# Patient Record
Sex: Female | Born: 1960
Health system: Southern US, Community
[De-identification: ages and names within clinical notes are randomized; demographics above are authoritative.]

## PROBLEM LIST (undated history)

## (undated) DIAGNOSIS — N390 Urinary tract infection, site not specified: Secondary | ICD-10-CM

## (undated) DIAGNOSIS — J45909 Unspecified asthma, uncomplicated: Secondary | ICD-10-CM

## (undated) DIAGNOSIS — I10 Essential (primary) hypertension: Secondary | ICD-10-CM

## (undated) DIAGNOSIS — B019 Varicella without complication: Secondary | ICD-10-CM

## (undated) DIAGNOSIS — R51 Headache: Secondary | ICD-10-CM

## (undated) DIAGNOSIS — R519 Headache, unspecified: Secondary | ICD-10-CM

## (undated) DIAGNOSIS — T7840XA Allergy, unspecified, initial encounter: Secondary | ICD-10-CM

## (undated) HISTORY — DX: Headache: R51

## (undated) HISTORY — DX: Headache, unspecified: R51.9

## (undated) HISTORY — DX: Urinary tract infection, site not specified: N39.0

## (undated) HISTORY — DX: Allergy, unspecified, initial encounter: T78.40XA

## (undated) HISTORY — DX: Unspecified asthma, uncomplicated: J45.909

## (undated) HISTORY — DX: Essential (primary) hypertension: I10

## (undated) HISTORY — PX: CHOLECYSTECTOMY: SHX55

## (undated) HISTORY — DX: Varicella without complication: B01.9

---

## 1998-10-23 ENCOUNTER — Other Ambulatory Visit: Admission: RE | Admit: 1998-10-23 | Discharge: 1998-10-23 | Payer: Self-pay | Admitting: *Deleted

## 2000-02-02 ENCOUNTER — Other Ambulatory Visit: Admission: RE | Admit: 2000-02-02 | Discharge: 2000-02-02 | Payer: Self-pay | Admitting: *Deleted

## 2000-03-17 ENCOUNTER — Encounter: Admission: RE | Admit: 2000-03-17 | Discharge: 2000-03-17 | Payer: Self-pay | Admitting: Obstetrics and Gynecology

## 2000-03-17 ENCOUNTER — Encounter: Payer: Self-pay | Admitting: Obstetrics and Gynecology

## 2001-02-15 ENCOUNTER — Other Ambulatory Visit: Admission: RE | Admit: 2001-02-15 | Discharge: 2001-02-15 | Payer: Self-pay | Admitting: Obstetrics and Gynecology

## 2002-02-20 ENCOUNTER — Other Ambulatory Visit: Admission: RE | Admit: 2002-02-20 | Discharge: 2002-02-20 | Payer: Self-pay | Admitting: Obstetrics and Gynecology

## 2003-04-10 ENCOUNTER — Other Ambulatory Visit: Admission: RE | Admit: 2003-04-10 | Discharge: 2003-04-10 | Payer: Self-pay | Admitting: Obstetrics and Gynecology

## 2010-07-14 ENCOUNTER — Emergency Department (HOSPITAL_BASED_OUTPATIENT_CLINIC_OR_DEPARTMENT_OTHER)
Admission: EM | Admit: 2010-07-14 | Discharge: 2010-07-14 | Payer: Self-pay | Source: Home / Self Care | Admitting: Emergency Medicine

## 2010-12-09 ENCOUNTER — Emergency Department (HOSPITAL_BASED_OUTPATIENT_CLINIC_OR_DEPARTMENT_OTHER)
Admission: EM | Admit: 2010-12-09 | Discharge: 2010-12-09 | Disposition: A | Discharge status: Home / Self Care | Payer: 59 | Attending: Emergency Medicine | Admitting: Emergency Medicine

## 2010-12-09 DIAGNOSIS — J45909 Unspecified asthma, uncomplicated: Secondary | ICD-10-CM | POA: Insufficient documentation

## 2010-12-09 DIAGNOSIS — S61209A Unspecified open wound of unspecified finger without damage to nail, initial encounter: Secondary | ICD-10-CM | POA: Insufficient documentation

## 2010-12-09 DIAGNOSIS — I10 Essential (primary) hypertension: Secondary | ICD-10-CM | POA: Insufficient documentation

## 2010-12-09 DIAGNOSIS — Y92009 Unspecified place in unspecified non-institutional (private) residence as the place of occurrence of the external cause: Secondary | ICD-10-CM | POA: Insufficient documentation

## 2010-12-09 DIAGNOSIS — W268XXA Contact with other sharp object(s), not elsewhere classified, initial encounter: Secondary | ICD-10-CM | POA: Insufficient documentation

## 2011-08-12 IMAGING — CR DG FOOT COMPLETE 3+V*R*
3 series · 3 of 3 positions shown · non-contrast
Comparison: None

CLINICAL DATA: Lateral pain after trauma.

RIGHT FOOT COMPLETE - 3+ VIEW

[t foot ap right]
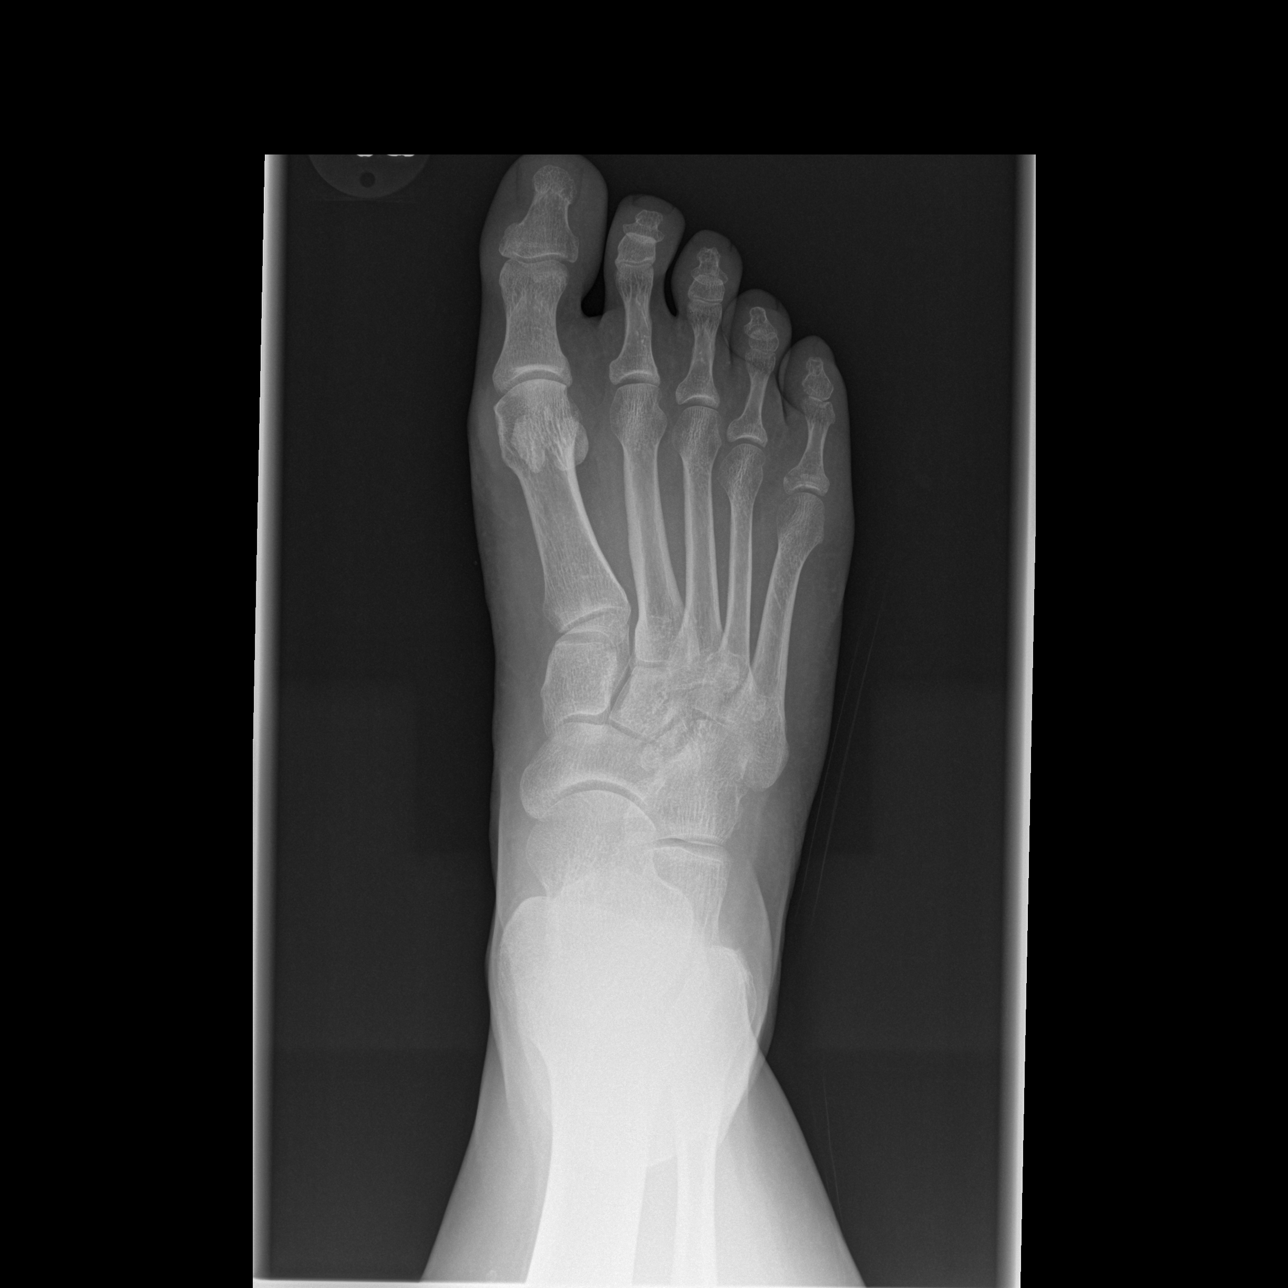

[t foot oblique right]
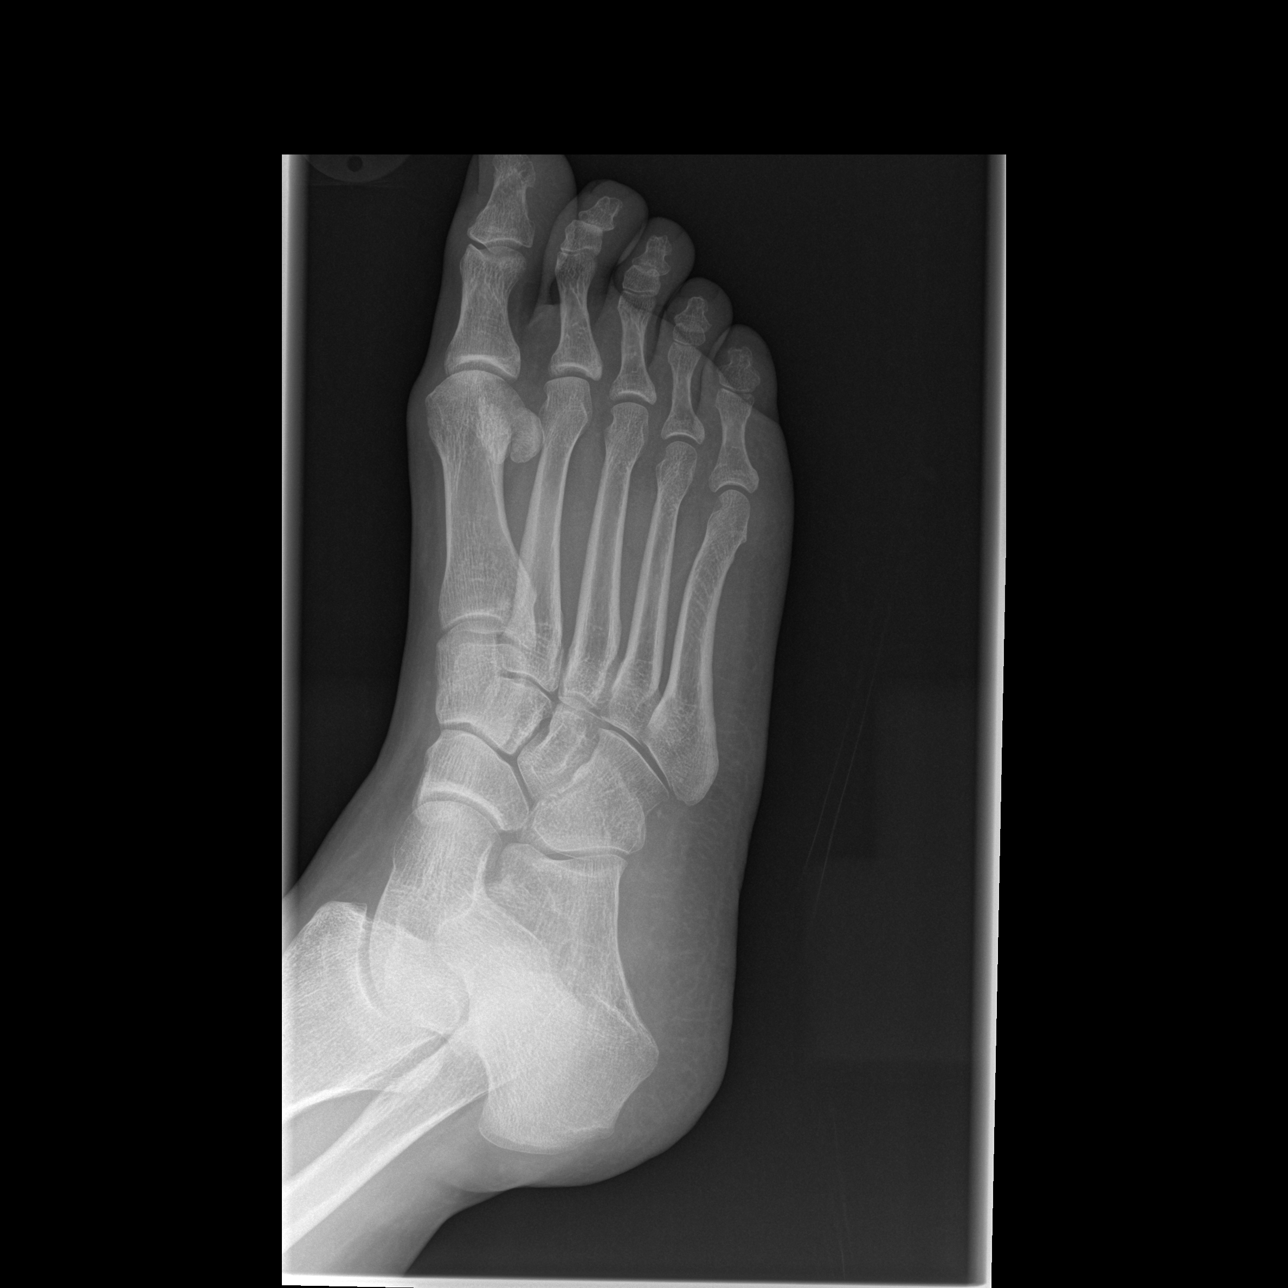

[t foot lat right]
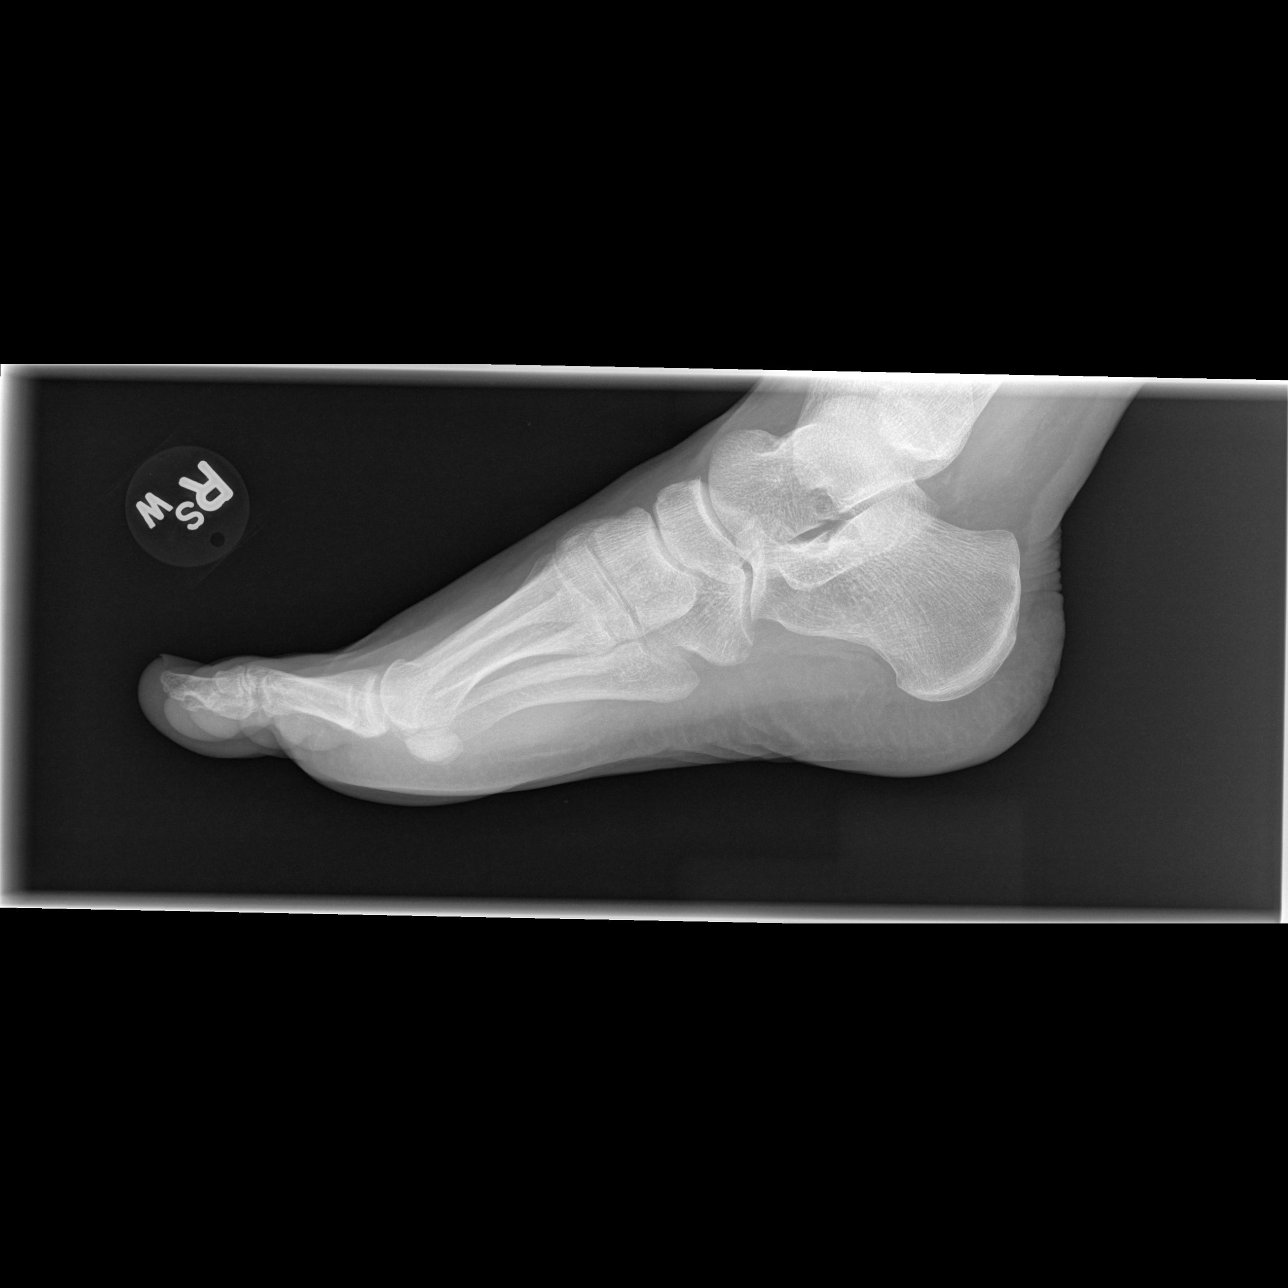

[3 of 3 positions shown; findings below may reference images not displayed]

FINDINGS: There is a fracture of the base of the proximal phalanx
of the fifth toe involving the mesial corner and extending to the
articular surface.  No displacement.  No other regional
abnormalities seen.
IMPRESSION: Fracture of the proximal mesial corner of the proximal phalanx of
the fifth toe extending to the articular surface.  No displacement.

## 2013-02-23 ENCOUNTER — Encounter: Payer: Self-pay | Admitting: Family Medicine

## 2013-02-23 ENCOUNTER — Ambulatory Visit (INDEPENDENT_AMBULATORY_CARE_PROVIDER_SITE_OTHER): Payer: BC Managed Care – PPO | Admitting: Family Medicine

## 2013-02-23 VITALS — BP 132/88 | HR 70 | Temp 97.9°F | Ht 66.0 in | Wt 187.0 lb

## 2013-02-23 DIAGNOSIS — J45909 Unspecified asthma, uncomplicated: Secondary | ICD-10-CM

## 2013-02-23 DIAGNOSIS — I10 Essential (primary) hypertension: Secondary | ICD-10-CM

## 2013-02-23 DIAGNOSIS — E669 Obesity, unspecified: Secondary | ICD-10-CM | POA: Insufficient documentation

## 2013-02-23 DIAGNOSIS — J4520 Mild intermittent asthma, uncomplicated: Secondary | ICD-10-CM | POA: Insufficient documentation

## 2013-02-23 DIAGNOSIS — J309 Allergic rhinitis, unspecified: Secondary | ICD-10-CM

## 2013-02-23 MED ORDER — LISINOPRIL 10 MG PO TABS: 10.0000 mg | ORAL_TABLET | Freq: Every day | ORAL | Status: DC

## 2013-02-23 NOTE — Progress Notes (Signed)
  Subjective:    Patient ID: Jordan Gallegos, female    DOB: 03/15/1961, 52 y.o.   MRN: [id number]  HPI Patient here to establish care. Past medical history significant for mild intermittent asthma. Generally only has about 1 or 2 flareups per month. She is allergic to cat dander and has some seasonal allergies as well. She takes Zyrtec regularly. She's had no evidence to suggest any persistent asthma.  Hypertension treated with lisinopril 10 mg daily. Well-controlled. No dizziness. No headaches. Compliant with therapy. She denies any cough or other side effects.  She's had intermittent urinary tract infections in the past. No prior surgeries. She takes some over-the-counter supplements.  Patient is married. No children. She works as a Engineer, water for [employer]. Never smoked. Rare alcohol.  Family history significant for mother with osteoarthritis. Both parents with hypertension. Father recently diagnosed Parkinson's disease.  Past Medical History  Diagnosis Date  . Asthma   . Chicken pox   . Frequent headaches   . Allergy   . Hypertension   . Urinary tract infection    History reviewed. No pertinent past surgical history.  reports that she has never smoked. She does not have any smokeless tobacco history on file. She reports that  drinks alcohol. She reports that she does not use illicit drugs. family history includes Arthritis in her mother; Hypertension in her father and mother; Parkinson's disease in her father. Allergies  Allergen Reactions  . Tylenol With Codeine #3 [Acetaminophen-Codeine] Nausea And Vomiting      Review of Systems  Constitutional: Negative for appetite change, fatigue and unexpected weight change.  HENT: Positive for congestion.   Eyes: Negative for visual disturbance.  Respiratory: Negative for cough, chest tightness, shortness of breath and wheezing.   Cardiovascular: Negative for chest pain, palpitations and leg swelling.   Endocrine: Negative for polydipsia and polyuria.  Genitourinary: Negative for dysuria.  Neurological: Negative for dizziness, seizures, syncope, weakness, light-headedness and headaches.  Psychiatric/Behavioral: Negative for dysphoric mood.       Objective:   Physical Exam  Constitutional: She appears well-developed and well-nourished.  Neck: Neck supple. No thyromegaly present.  Cardiovascular: Normal rate and regular rhythm.  Exam reveals no gallop.   No murmur heard. Pulmonary/Chest: Effort normal and breath sounds normal. No respiratory distress. She has no wheezes. She has no rales.  Musculoskeletal: She exhibits no edema.          Assessment & Plan:  #1 hypertension. Stable. Refill lisinopril for one year. #2 perennial and seasonal allergies. Stable on Zyrtec. #3 mild intermittent asthma. Continue albuterol as needed. #4 health maintenance. We have suggested complete physical. She is undecided regarding colonoscopy screening. She declines flu vaccine.

## 2013-02-23 NOTE — Patient Instructions (Addendum)
Consider complete physical at some point this year if interested.

## 2013-12-17 ENCOUNTER — Ambulatory Visit: Payer: BC Managed Care – PPO | Admitting: Family Medicine

## 2013-12-20 ENCOUNTER — Emergency Department (HOSPITAL_BASED_OUTPATIENT_CLINIC_OR_DEPARTMENT_OTHER)
Admission: EM | Admit: 2013-12-20 | Discharge: 2013-12-20 | Disposition: A | Discharge status: Home / Self Care | Payer: BC Managed Care – PPO | Attending: Emergency Medicine | Admitting: Emergency Medicine

## 2013-12-20 ENCOUNTER — Encounter (HOSPITAL_BASED_OUTPATIENT_CLINIC_OR_DEPARTMENT_OTHER): Payer: Self-pay | Admitting: Emergency Medicine

## 2013-12-20 DIAGNOSIS — R109 Unspecified abdominal pain: Secondary | ICD-10-CM

## 2013-12-20 DIAGNOSIS — J45909 Unspecified asthma, uncomplicated: Secondary | ICD-10-CM | POA: Insufficient documentation

## 2013-12-20 DIAGNOSIS — Z87440 Personal history of urinary (tract) infections: Secondary | ICD-10-CM | POA: Insufficient documentation

## 2013-12-20 DIAGNOSIS — I10 Essential (primary) hypertension: Secondary | ICD-10-CM | POA: Insufficient documentation

## 2013-12-20 DIAGNOSIS — Z3202 Encounter for pregnancy test, result negative: Secondary | ICD-10-CM | POA: Insufficient documentation

## 2013-12-20 DIAGNOSIS — R1032 Left lower quadrant pain: Secondary | ICD-10-CM | POA: Insufficient documentation

## 2013-12-20 DIAGNOSIS — Z8619 Personal history of other infectious and parasitic diseases: Secondary | ICD-10-CM | POA: Insufficient documentation

## 2013-12-20 DIAGNOSIS — E669 Obesity, unspecified: Secondary | ICD-10-CM | POA: Insufficient documentation

## 2013-12-20 DIAGNOSIS — Z79899 Other long term (current) drug therapy: Secondary | ICD-10-CM | POA: Insufficient documentation

## 2013-12-20 LAB — CBC WITH DIFFERENTIAL/PLATELET
Basophils Absolute: 0.1 10*3/uL (ref 0.0–0.1)
Basophils Relative: 1 % (ref 0–1)
Eosinophils Absolute: 0.2 10*3/uL (ref 0.0–0.7)
Eosinophils Relative: 2 % (ref 0–5)
HEMATOCRIT: 42.8 % (ref 36.0–46.0)
Hemoglobin: 14.2 g/dL (ref 12.0–15.0)
LYMPHS PCT: 28 % (ref 12–46)
Lymphs Abs: 2.3 10*3/uL (ref 0.7–4.0)
MCH: 30.7 pg (ref 26.0–34.0)
MCHC: 33.2 g/dL (ref 30.0–36.0)
MCV: 92.4 fL (ref 78.0–100.0)
MONO ABS: 0.8 10*3/uL (ref 0.1–1.0)
Monocytes Relative: 9 % (ref 3–12)
NEUTROS ABS: 5 10*3/uL (ref 1.7–7.7)
Neutrophils Relative %: 60 % (ref 43–77)
Platelets: 278 10*3/uL (ref 150–400)
RBC: 4.63 MIL/uL (ref 3.87–5.11)
RDW: 12.9 % (ref 11.5–15.5)
WBC: 8.3 10*3/uL (ref 4.0–10.5)

## 2013-12-20 LAB — URINALYSIS, ROUTINE W REFLEX MICROSCOPIC
BILIRUBIN URINE: NEGATIVE
GLUCOSE, UA: NEGATIVE mg/dL
KETONES UR: NEGATIVE mg/dL
Leukocytes, UA: NEGATIVE
Nitrite: NEGATIVE
PH: 7 (ref 5.0–8.0)
PROTEIN: NEGATIVE mg/dL
Specific Gravity, Urine: 1.012 (ref 1.005–1.030)
Urobilinogen, UA: 0.2 mg/dL (ref 0.0–1.0)

## 2013-12-20 LAB — COMPREHENSIVE METABOLIC PANEL
ALT: 14 U/L (ref 0–35)
ANION GAP: 16 — AB (ref 5–15)
AST: 14 U/L (ref 0–37)
Albumin: 4.2 g/dL (ref 3.5–5.2)
Alkaline Phosphatase: 63 U/L (ref 39–117)
BUN: 14 mg/dL (ref 6–23)
CO2: 23 mEq/L (ref 19–32)
Calcium: 10.2 mg/dL (ref 8.4–10.5)
Chloride: 101 mEq/L (ref 96–112)
Creatinine, Ser: 0.8 mg/dL (ref 0.50–1.10)
GFR calc Af Amer: >90 mL/min (ref >90)
GFR calc non Af Amer: 84 mL/min — ABNORMAL LOW (ref >90)
Glucose, Bld: 117 mg/dL — ABNORMAL HIGH (ref 70–99)
POTASSIUM: 3.9 meq/L (ref 3.7–5.3)
SODIUM: 140 meq/L (ref 137–147)
TOTAL PROTEIN: 7.4 g/dL (ref 6.0–8.3)
Total Bilirubin: 0.3 mg/dL (ref 0.3–1.2)

## 2013-12-20 LAB — URINE MICROSCOPIC-ADD ON

## 2013-12-20 LAB — LIPASE, BLOOD: Lipase: 24 U/L (ref 11–59)

## 2013-12-20 LAB — PREGNANCY, URINE: Preg Test, Ur: NEGATIVE

## 2013-12-20 MED ORDER — PB-HYOSCY-ATROPINE-SCOPOLAMINE 16.2 MG PO TABS: 1.0000 | ORAL_TABLET | Freq: Three times a day (TID) | ORAL | Status: DC | PRN

## 2013-12-20 MED ORDER — GI COCKTAIL ~~LOC~~
30.0000 mL | Freq: Once | ORAL | Status: AC
  Administered 2013-12-20: 30 mL via ORAL

## 2013-12-20 MED ORDER — GI COCKTAIL ~~LOC~~
ORAL | Status: AC
  Filled 2013-12-20: qty 30

## 2013-12-20 NOTE — Discharge Instructions (Signed)
You've been prescribed the same medicine that you got here tonight in tablet form. Call Dr. Elease Hashimoto to schedule an office visit. We feel that he should have a referral to a gastroenterologist and that you should have a colonoscopy. Your blood pressure should be rechecked within a week. Tonight it was elevated at 179/87

## 2013-12-20 NOTE — ED Notes (Signed)
Pt c/o diffuse abd pain with nausea only x 5 hrs

## 2013-12-20 NOTE — ED Notes (Signed)
MD at bedside discussing test results and dispo plan of care. 

## 2013-12-20 NOTE — ED Provider Notes (Signed)
CSN: [id number]     Arrival date & time 12/20/13  0011 History   First MD Initiated Contact with Patient 12/20/13 0018     Chief Complaint  Patient presents with  . Abdominal Pain     (Consider location/radiation/quality/duration/timing/severity/associated sxs/prior Treatment) HPI Complains of epigastric pain onset 7 PM tonight after eating pork chips. Pain became worse at 10 PM tonight, sharp and severe. Nonradiating. Pain is presently improving steadily, without treatment. Patient has had similar pain several times in the past which resolve spontaneously after walking. Her last bowel movement was yesterday morning, normal. Associated symptoms include nausea, no vomiting. No chest pain. Last menstrual period 3 weeks ago, and normal. No other associated symptoms. Past Medical History  Diagnosis Date  . Asthma   . Chicken pox   . Frequent headaches   . Allergy   . Hypertension   . Urinary tract infection    History reviewed. No pertinent past surgical history. Family History  Problem Relation Age of Onset  . Arthritis Mother   . Hypertension Mother   . Hypertension Father   . Parkinson's disease Father    past surgical history no abdominal surgeries History  Substance Use Topics  . Smoking status: Never Smoker   . Smokeless tobacco: Not on file  . Alcohol Use: Yes   OB History   Grav Para Term Preterm Abortions TAB SAB Ect Mult Living                 Review of Systems  Gastrointestinal: Positive for nausea and abdominal pain.       Nausea has resolved      Allergies  Tylenol with codeine #3  Home Medications   Prior to Admission medications   Medication Sig Start Date End Date Taking? Authorizing Provider  albuterol (PROVENTIL HFA;VENTOLIN HFA) 108 (90 BASE) MCG/ACT inhaler Inhale 2 puffs into the lungs as needed for wheezing.    Historical Provider, MD  cetirizine (ZYRTEC) 10 MG tablet Take 10 mg by mouth daily.    Historical Provider, MD  Coenzyme Q10 100 MG  TABS Take by mouth daily.    Historical Provider, MD  Flaxseed, Linseed, OIL Take 1,400 mg by mouth daily.    Historical Provider, MD  levonorgestrel-ethinyl estradiol (ORSYTHIA) 0.1-20 MG-MCG tablet Take 1 tablet by mouth daily.    Historical Provider, MD  lisinopril (PRINIVIL,ZESTRIL) 10 MG tablet Take 1 tablet (10 mg total) by mouth daily. 02/23/13   Eulas Post, MD  Magnesium 400 MG CAPS Take 400 mg by mouth daily.    Historical Provider, MD  Multiple Vitamins-Calcium (ONE-A-DAY WITHIN PO) Take by mouth daily.    Historical Provider, MD  NON FORMULARY Asthma Clear. Take one daily    Historical Provider, MD  vitamin E (VITAMIN E) 400 UNIT capsule Take 400 Units by mouth daily.    Historical Provider, MD   BP 179/87  Pulse 66  Temp(Src) 98.2 F (36.8 C) (Oral)  Resp 18  Ht 5\' 6"  (1.676 m)  Wt 180 lb (81.647 kg)  BMI 29.07 kg/m2  SpO2 100%  LMP 11/28/2013 Physical Exam  Nursing note and vitals reviewed. Constitutional: She appears well-developed and well-nourished.  HENT:  Head: Normocephalic and atraumatic.  Eyes: Conjunctivae are normal. Pupils are equal, round, and reactive to light.  Neck: Neck supple. No tracheal deviation present. No thyromegaly present.  Cardiovascular: Normal rate and regular rhythm.   No murmur heard. Pulmonary/Chest: Effort normal and breath sounds normal.  Abdominal: Soft. Bowel  sounds are normal. She exhibits no distension. There is tenderness.  Obese. Tender over bilateral lower quadrants, no guarding rigidity or rebound  Musculoskeletal: Normal range of motion. She exhibits no edema and no tenderness.  Neurological: She is alert. Coordination normal.  Skin: Skin is warm and dry. No rash noted.  Psychiatric: She has a normal mood and affect.    ED Course  Procedures (including critical care time) Labs Review Labs Reviewed  URINALYSIS, ROUTINE W REFLEX MICROSCOPIC  PREGNANCY, URINE    Imaging Review No results found.   EKG  Interpretation None      declines pain medicine   140 am pain continues to improve  2:15 AM patient states pain is steadily improving, waxes and wanes. She is concerned that she may not be an asleep with the amount of pain that she has. We will try GI cocktail  2:40 AM pain is improved after GI cocktail. Patient comfortable. Results for orders placed during the hospital encounter of 12/20/13  URINALYSIS, ROUTINE W REFLEX MICROSCOPIC      Result Value Ref Range   Color, Urine YELLOW  YELLOW   APPearance CLEAR  CLEAR   Specific Gravity, Urine 1.012  1.005 - 1.030   pH 7.0  5.0 - 8.0   Glucose, UA NEGATIVE  NEGATIVE mg/dL   Hgb urine dipstick SMALL (*) NEGATIVE   Bilirubin Urine NEGATIVE  NEGATIVE   Ketones, ur NEGATIVE  NEGATIVE mg/dL   Protein, ur NEGATIVE  NEGATIVE mg/dL   Urobilinogen, UA 0.2  0.0 - 1.0 mg/dL   Nitrite NEGATIVE  NEGATIVE   Leukocytes, UA NEGATIVE  NEGATIVE  PREGNANCY, URINE      Result Value Ref Range   Preg Test, Ur NEGATIVE  NEGATIVE  URINE MICROSCOPIC-ADD ON      Result Value Ref Range   Squamous Epithelial / LPF FEW (*) RARE   WBC, UA 3-6  <3 WBC/hpf   RBC / HPF 3-6  <3 RBC/hpf   Bacteria, UA MANY (*) RARE  COMPREHENSIVE METABOLIC PANEL      Result Value Ref Range   Sodium 140  137 - 147 mEq/L   Potassium 3.9  3.7 - 5.3 mEq/L   Chloride 101  96 - 112 mEq/L   CO2 23  19 - 32 mEq/L   Glucose, Bld 117 (*) 70 - 99 mg/dL   BUN 14  6 - 23 mg/dL   Creatinine, Ser 0.80  0.50 - 1.10 mg/dL   Calcium 10.2  8.4 - 10.5 mg/dL   Total Protein 7.4  6.0 - 8.3 g/dL   Albumin 4.2  3.5 - 5.2 g/dL   AST 14  0 - 37 U/L   ALT 14  0 - 35 U/L   Alkaline Phosphatase 63  39 - 117 U/L   Total Bilirubin 0.3  0.3 - 1.2 mg/dL   GFR calc non Af Amer 84 (*) >90 mL/min   GFR calc Af Amer >90  >90 mL/min   Anion gap 16 (*) 5 - 15  CBC WITH DIFFERENTIAL      Result Value Ref Range   WBC 8.3  4.0 - 10.5 K/uL   RBC 4.63  3.87 - 5.11 MIL/uL   Hemoglobin 14.2  12.0 - 15.0 g/dL    HCT 42.8  36.0 - 46.0 %   MCV 92.4  78.0 - 100.0 fL   MCH 30.7  26.0 - 34.0 pg   MCHC 33.2  30.0 - 36.0 g/dL   RDW 12.9  11.5 - 15.5 %   Platelets 278  150 - 400 K/uL   Neutrophils Relative % 60  43 - 77 %   Neutro Abs 5.0  1.7 - 7.7 K/uL   Lymphocytes Relative 28  12 - 46 %   Lymphs Abs 2.3  0.7 - 4.0 K/uL   Monocytes Relative 9  3 - 12 %   Monocytes Absolute 0.8  0.1 - 1.0 K/uL   Eosinophils Relative 2  0 - 5 %   Eosinophils Absolute 0.2  0.0 - 0.7 K/uL   Basophils Relative 1  0 - 1 %   Basophils Absolute 0.1  0.0 - 0.1 K/uL  LIPASE, BLOOD      Result Value Ref Range   Lipase 24  11 - 59 U/L   No results found.  MDM  Since there is a spasmodic component to her pain I will prescribe Donnatal. She is instructed to get blood pressure rechecked within one week. Also to call her primary care physician for referral to a gastroenterologist as she's never had a colonoscopy Diagnosis #1 abdominal pain #2 hypertension Final diagnoses:  None        Orlie Dakin, MD 12/20/13 [phone]

## 2013-12-20 NOTE — ED Notes (Signed)
MD at bedside. 

## 2013-12-22 ENCOUNTER — Encounter: Payer: Self-pay | Admitting: Family Medicine

## 2013-12-23 ENCOUNTER — Encounter: Payer: Self-pay | Admitting: Family Medicine

## 2013-12-24 ENCOUNTER — Telehealth: Payer: Self-pay | Admitting: Family Medicine

## 2013-12-24 NOTE — Telephone Encounter (Signed)
Pt requesting referral to GI specialist due to abdominal pain, was seen at [facility] on 12/20/13.

## 2013-12-24 NOTE — Telephone Encounter (Signed)
Spoke with Patient, informed patient that she will need to make ED follow up appt.

## 2013-12-25 ENCOUNTER — Encounter: Payer: Self-pay | Admitting: Internal Medicine

## 2013-12-26 ENCOUNTER — Ambulatory Visit: Payer: BC Managed Care – PPO | Admitting: Family Medicine

## 2013-12-28 ENCOUNTER — Ambulatory Visit (INDEPENDENT_AMBULATORY_CARE_PROVIDER_SITE_OTHER): Payer: BC Managed Care – PPO | Admitting: Internal Medicine

## 2013-12-28 ENCOUNTER — Encounter: Payer: Self-pay | Admitting: Internal Medicine

## 2013-12-28 VITALS — BP 128/64 | HR 64 | Ht 66.0 in | Wt 189.0 lb

## 2013-12-28 DIAGNOSIS — R1013 Epigastric pain: Secondary | ICD-10-CM

## 2013-12-28 DIAGNOSIS — K8020 Calculus of gallbladder without cholecystitis without obstruction: Secondary | ICD-10-CM | POA: Insufficient documentation

## 2013-12-28 MED ORDER — HYDROCODONE-ACETAMINOPHEN 5-325 MG PO TABS: 1.0000 | ORAL_TABLET | Freq: Four times a day (QID) | ORAL | Status: DC | PRN

## 2013-12-28 NOTE — Assessment & Plan Note (Signed)
The symptom pattern is a decent story for symptomatic cholelithiasis I believe. We will get an abdominal ultrasound she has gallstones or more for to a surgeon. If there aren't any we'll need to consider a HIDA scan with ejection fraction. She was advised about persistent symptoms fever and reasons to go to the emergency department.

## 2013-12-28 NOTE — Patient Instructions (Signed)
Today you have been given a printed rx for vicodin to take to your pharmacy.  You have been scheduled for an abdominal ultrasound at [facility] (1st floor of hospital) on 01/02/14 at 8:00am. Please arrive 15 minutes prior to your appointment for registration. Make certain not to have anything to eat or drink after midnight. Should you need to reschedule your appointment, please contact radiology at [phone]. This test typically takes about 30 minutes to perform.  I appreciate the opportunity to care for you.

## 2013-12-28 NOTE — Progress Notes (Signed)
  Referred by: Eulas Post, MD  Subjective:    Patient ID: Jordan Gallegos, female    DOB: [birth year], 53 y.o.   MRN: [id number]  HPI Very nice lady was had intermittent epigastric pain. It started sometime in the last year. It is worsened, it is typically postprandial. It starts in the evening after her evening meal. It starts in the epigastrium and seems to radiate out over the mid and lower abdomen more diffusely with time. She had a couple of occasions happening in the winter, in the spring she had one that woke her up at night. She had severe pain in early July and went to the ER where the doctor thought maybe she was having intestinal spasms and prescribed Donnatal. That did not help when she had a recurrent episode. She is currently afraid to eat much. It's not always her fatty foods but it has occurred after eating pork chips and also after eating ham. She does not have any bowel habit problems. She has not had nausea or vomiting. There is no typical heartburn symptomatology.   Review of Systems Allergies and sinus problems all otherwise negative    Objective:   Physical Exam General:  Well-developed, well-nourished and in no acute distress Eyes:  anicteric. ENT:   Mouth and posterior pharynx free of lesions.  Neck:   supple w/o thyromegaly or mass.  Lungs: Clear to auscultation bilaterally. Heart:  S1S2, no rubs, murmurs, gallops. Abdomen:  soft, non-tender, no hepatosplenomegaly, hernia, or mass and BS+.  Lymph:  no cervical or supraclavicular adenopathy. Extremities:   no edema Skin   no rash. Neuro:  A&O x 3.  Psych:  appropriate mood and  Affect.   Data Reviewed: ER notes labs, liver function tests were normal. Lipase was normal, July 2    Assessment & Plan:  Abdominal pain, epigastric The symptom pattern is a decent story for symptomatic cholelithiasis I believe. We will get an abdominal ultrasound she has gallstones or more for to a surgeon. If there aren't any  we'll need to consider a HIDA scan with ejection fraction. She was advised about persistent symptoms fever and reasons to go to the emergency department.   She should have a screening colonoscopy at some point as well. We've decided to defer that at this point we'll followup once the ultrasound is completed.  I appreciate the opportunity to care for this patient. CC: Eulas Post, MD

## 2014-01-02 ENCOUNTER — Ambulatory Visit (HOSPITAL_COMMUNITY)
Admission: RE | Admit: 2014-01-02 | Discharge: 2014-01-02 | Disposition: A | Discharge status: Home / Self Care | Payer: BC Managed Care – PPO | Source: Ambulatory Visit | Attending: Internal Medicine | Admitting: Internal Medicine

## 2014-01-02 ENCOUNTER — Encounter: Payer: Self-pay | Admitting: Internal Medicine

## 2014-01-02 DIAGNOSIS — K8020 Calculus of gallbladder without cholecystitis without obstruction: Secondary | ICD-10-CM | POA: Insufficient documentation

## 2014-01-02 DIAGNOSIS — R1013 Epigastric pain: Secondary | ICD-10-CM | POA: Insufficient documentation

## 2014-01-02 NOTE — Progress Notes (Signed)
Quick Note:  She has gallstones and needs to see a surgeon about this and her epigastric pain - symptomatic cholelithiasis. Please schedule with a surgeon at [facility]  She should schedule a screening colonoscopy after the surgery visit and cholecystectomy is completed.    ______

## 2014-01-03 ENCOUNTER — Other Ambulatory Visit: Payer: Self-pay

## 2014-01-03 ENCOUNTER — Telehealth: Payer: Self-pay | Admitting: Internal Medicine

## 2014-01-03 DIAGNOSIS — R1013 Epigastric pain: Secondary | ICD-10-CM

## 2014-01-03 DIAGNOSIS — K8020 Calculus of gallbladder without cholecystitis without obstruction: Secondary | ICD-10-CM

## 2014-01-03 DIAGNOSIS — K8309 Other cholangitis: Secondary | ICD-10-CM

## 2014-01-03 NOTE — Telephone Encounter (Signed)
See results note on [place] for additional details.

## 2014-01-23 ENCOUNTER — Encounter (INDEPENDENT_AMBULATORY_CARE_PROVIDER_SITE_OTHER): Payer: Self-pay | Admitting: Surgery

## 2014-01-23 ENCOUNTER — Ambulatory Visit (INDEPENDENT_AMBULATORY_CARE_PROVIDER_SITE_OTHER): Payer: BC Managed Care – PPO | Admitting: Surgery

## 2014-01-23 VITALS — BP 116/62 | HR 68 | Temp 98.0°F | Resp 18 | Ht 66.0 in | Wt 184.0 lb

## 2014-01-23 DIAGNOSIS — K8020 Calculus of gallbladder without cholecystitis without obstruction: Secondary | ICD-10-CM

## 2014-01-23 NOTE — Progress Notes (Signed)
General Surgery [facility]  Chief Complaint  Patient presents with  . New Evaluation    cholelithiasis, evaluate for cholecystectomy - referral from Dr. Silvano Rusk    HISTORY: Patient is a 53 year old female referred by her gastroenterologist for symptomatic cholelithiasis. Patient has been having symptoms for approximately one year. She describes epigastric abdominal pain following meals. This may last up to 5 hours it is quite severe. It often occurs late at night. It is associated with nausea but no emesis. She denies fevers or chills. She denies jaundice or acholic stools.  Patient has had no prior abdominal surgery. There is a family history of gallbladder disease in a maternal grandmother.  Past Medical History  Diagnosis Date  . Asthma   . Chicken pox   . Frequent headaches   . Allergy   . Hypertension   . Urinary tract infection     Current Outpatient Prescriptions  Medication Sig Dispense Refill  . albuterol (PROVENTIL HFA;VENTOLIN HFA) 108 (90 BASE) MCG/ACT inhaler Inhale 2 puffs into the lungs as needed for wheezing.      . Coenzyme Q10 100 MG TABS Take by mouth daily.      . Flaxseed, Linseed, OIL Take 1,400 mg by mouth daily.      Marland Kitchen HYDROcodone-acetaminophen (NORCO/VICODIN) 5-325 MG per tablet Take 1 tablet by mouth every 6 (six) hours as needed for moderate pain.  15 tablet  0  . levocetirizine (XYZAL) 5 MG tablet Take 5 mg by mouth every evening.       Marland Kitchen levonorgestrel-ethinyl estradiol (ORSYTHIA) 0.1-20 MG-MCG tablet Take 1 tablet by mouth daily.      Marland Kitchen lisinopril (PRINIVIL,ZESTRIL) 10 MG tablet Take 1 tablet (10 mg total) by mouth daily.  90 tablet  3  . Magnesium 400 MG CAPS Take 400 mg by mouth daily.      . montelukast (SINGULAIR) 10 MG tablet Take 10 mg by mouth at bedtime.       . Multiple Vitamins-Calcium (ONE-A-DAY WITHIN PO) Take by mouth daily.      . vitamin E (VITAMIN E) 400 UNIT capsule Take 400 Units by mouth daily.       No  current facility-administered medications for this visit.    Allergies  Allergen Reactions  . Tylenol With Codeine #3 [Acetaminophen-Codeine] Nausea And Vomiting    Family History  Problem Relation Age of Onset  . Arthritis Mother   . Hypertension Mother   . Hypertension Father   . Parkinson's disease Father   . Colon cancer Neg Hx   . Stomach cancer Neg Hx     History   Social History  . Marital Status: Married    Spouse Name: N/A    Number of Children: 0  . Years of Education: N/A   Occupational History  .     Social History Main Topics  . Smoking status: Never Smoker   . Smokeless tobacco: Never Used  . Alcohol Use: No  . Drug Use: No  . Sexual Activity: Yes    Birth Control/ Protection: Pill   Other Topics Concern  . 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 Denies acholic stools. Denies fevers or chills.  EXAM: Filed Vitals:  01/23/14 0940  BP: 116/62  Pulse: 68  Temp: 98 F (36.7 C)  Resp: 18    GENERAL: well-developed, well-nourished, no acute distress HEENT: normocephalic; pupils equal and reactive; sclerae clear; dentition good; mucous membranes moist NECK:  No palpable masses in the thyroid bed; symmetric on extension; no palpable anterior or posterior cervical lymphadenopathy; no supraclavicular masses; no tenderness CHEST: clear to auscultation bilaterally without rales, rhonchi, or wheezes CARDIAC: regular rate and rhythm without significant murmur; peripheral pulses are full ABDOMEN: soft without distension; bowel sounds present; no mass; no hepatosplenomegaly; small umbilical hernia, asymptomatic EXT:  non-tender without edema; no deformity NEURO: no gross focal deficits; no sign of tremor   LABORATORY RESULTS: See [facility] (CHL-Epic) for most  recent results  RADIOLOGY RESULTS: See [facility] (CHL-Epic) for most recent results  IMPRESSION: Symptomatic cholelithiasis, probable chronic cholecystitis  PLAN: I discussed the above findings at length with the patient and her husband. I provided them with written literature to review at home.  I have recommended laparoscopic cholecystectomy with intraoperative cholangiography. We discussed the procedure at length. We discussed potential complications including common bile duct injury and conversion to open surgery. We discussed the hospital stay to be anticipated in the postoperative recovery and return to work. Patient would like to proceed with surgery in the near future.  The risks and benefits of the procedure have been discussed at length with the patient.  The patient understands the proposed procedure, potential alternative treatments, and the course of recovery to be expected.  All of the patient's questions have been answered at this time.  The patient wishes to proceed with surgery.  Earnstine Regal, MD, [facility]  Primary Care Physician: Eulas Post, MD

## 2014-01-23 NOTE — Patient Instructions (Signed)
  [facility]  LAPAROSCOPIC SURGERY - POST-OP INSTRUCTIONS  Always review your discharge instruction sheet given to you by the facility where your surgery was performed.  A prescription for pain medication may be given to you upon discharge.  Take your pain medication as prescribed.  If narcotic pain medicine is not needed, then you may take acetaminophen (Tylenol) or ibuprofen (Advil) as needed.  Take your usually prescribed medications unless otherwise directed.  If you need a refill on your pain medication, please contact your pharmacy.  They will contact our office to request authorization. Prescriptions will not be filled after 5 P.M. or on weekends.  You should follow a light diet the first few days after arrival home, such as soup and crackers or toast.  Be sure to include plenty of fluids daily.  Most patients will experience some swelling and bruising in the area of the incisions.  Ice packs will help.  Swelling and bruising can take several days to resolve.   It is common to experience some constipation if taking pain medication after surgery.  Increasing fluid intake and taking a stool softener (such as Colace) will usually help or prevent this problem from occurring.  A mild laxative (Milk of Magnesia or Miralax) should be taken according to package instructions if there are no bowel movements after 48 hours.  Unless discharge instructions indicate otherwise, you may remove your bandages 24-48 hours after surgery, and you may shower at that time.  You may have steri-strips (small skin tapes) in place directly over the incision.  These strips should be left on the skin for 7-10 days.  If your surgeon used skin glue on the incision, you may shower in 24 hours.  The glue will flake off over the next 2-3 weeks.  Any sutures or staples will be removed at the office during your follow-up visit.  ACTIVITIES:  You may resume regular (light) daily activities beginning the  next day-such as daily self-care, walking, climbing stairs-gradually increasing activities as tolerated.  You may have sexual intercourse when it is comfortable.  Refrain from any heavy lifting or straining until approved by your doctor.  You may drive when you are no longer taking prescription pain medication, you can comfortably wear a seatbelt, and you can safely maneuver your car and apply brakes.  You should see your doctor in the office for a follow-up appointment approximately 2-3 weeks after your surgery.  Make sure that you call for this appointment within a day or two after you arrive home to insure a convenient appointment time.  WHEN TO CALL YOUR DOCTOR: 1. Fever over 101.0 2. Inability to urinate 3. Continued bleeding from incision 4. Increased pain, redness, or drainage from the incision 5. Increasing abdominal pain  The clinic staff is available to answer your questions during regular business hours.  Please don't hesitate to call and ask to speak to one of the nurses for clinical concerns.  If you have a medical emergency, go to the nearest emergency room or call 911.  A surgeon from [facility] is always on call for the hospital.  Shaton Lore M. Willmer Fellers, MD, FACS [facility] Office: [phone] Toll Free:  [phone] FAX [phone]  Web site: [email or web address] 

## 2014-01-29 ENCOUNTER — Encounter (INDEPENDENT_AMBULATORY_CARE_PROVIDER_SITE_OTHER): Payer: Self-pay | Admitting: Surgery

## 2014-02-17 ENCOUNTER — Encounter: Payer: Self-pay | Admitting: Family Medicine

## 2014-02-18 ENCOUNTER — Other Ambulatory Visit: Payer: Self-pay

## 2014-02-18 MED ORDER — LISINOPRIL 10 MG PO TABS: 10.0000 mg | ORAL_TABLET | Freq: Every day | ORAL | Status: DC

## 2014-02-26 ENCOUNTER — Encounter: Payer: Self-pay | Admitting: Family Medicine

## 2014-02-26 ENCOUNTER — Ambulatory Visit (INDEPENDENT_AMBULATORY_CARE_PROVIDER_SITE_OTHER): Payer: BC Managed Care – PPO | Admitting: Family Medicine

## 2014-02-26 VITALS — BP 130/80 | HR 78 | Temp 98.1°F | Ht 66.0 in | Wt 182.0 lb

## 2014-02-26 DIAGNOSIS — Z0000 Encounter for general adult medical examination without abnormal findings: Secondary | ICD-10-CM

## 2014-02-26 LAB — TSH: TSH: 0.89 u[IU]/mL (ref 0.35–4.50)

## 2014-02-26 LAB — BASIC METABOLIC PANEL
BUN: 13 mg/dL (ref 6–23)
CHLORIDE: 105 meq/L (ref 96–112)
CO2: 24 mEq/L (ref 19–32)
Calcium: 9.7 mg/dL (ref 8.4–10.5)
Creatinine, Ser: 0.8 mg/dL (ref 0.4–1.2)
GFR: 78.94 mL/min (ref >60.00)
GLUCOSE: 83 mg/dL (ref 70–99)
POTASSIUM: 4.1 meq/L (ref 3.5–5.1)
Sodium: 140 mEq/L (ref 135–145)

## 2014-02-26 LAB — HEPATIC FUNCTION PANEL
ALK PHOS: 61 U/L (ref 39–117)
ALT: 22 U/L (ref 0–35)
AST: 23 U/L (ref 0–37)
Albumin: 4.4 g/dL (ref 3.5–5.2)
Bilirubin, Direct: 0 mg/dL (ref 0.0–0.3)
Total Bilirubin: 0.7 mg/dL (ref 0.2–1.2)
Total Protein: 7.5 g/dL (ref 6.0–8.3)

## 2014-02-26 LAB — CBC WITH DIFFERENTIAL/PLATELET
Basophils Absolute: 0.1 10*3/uL (ref 0.0–0.1)
Basophils Relative: 1 % (ref 0.0–3.0)
EOS PCT: 2.9 % (ref 0.0–5.0)
Eosinophils Absolute: 0.2 10*3/uL (ref 0.0–0.7)
HCT: 44.8 % (ref 36.0–46.0)
Hemoglobin: 15.1 g/dL — ABNORMAL HIGH (ref 12.0–15.0)
Lymphocytes Relative: 31.5 % (ref 12.0–46.0)
Lymphs Abs: 2.4 10*3/uL (ref 0.7–4.0)
MCHC: 33.7 g/dL (ref 30.0–36.0)
MCV: 92.9 fl (ref 78.0–100.0)
MONO ABS: 0.4 10*3/uL (ref 0.1–1.0)
MONOS PCT: 5.7 % (ref 3.0–12.0)
Neutro Abs: 4.5 10*3/uL (ref 1.4–7.7)
Neutrophils Relative %: 58.9 % (ref 43.0–77.0)
PLATELETS: 258 10*3/uL (ref 150.0–400.0)
RBC: 4.82 Mil/uL (ref 3.87–5.11)
RDW: 14.2 % (ref 11.5–15.5)
WBC: 7.6 10*3/uL (ref 4.0–10.5)

## 2014-02-26 LAB — LIPID PANEL
Cholesterol: 235 mg/dL — ABNORMAL HIGH (ref 0–200)
HDL: 57.9 mg/dL (ref >39.00)
LDL CALC: 144 mg/dL — AB (ref 0–99)
NONHDL: 177.1
Total CHOL/HDL Ratio: 4
Triglycerides: 167 mg/dL — ABNORMAL HIGH (ref 0.0–149.0)
VLDL: 33.4 mg/dL (ref 0.0–40.0)

## 2014-02-26 MED ORDER — LISINOPRIL 10 MG PO TABS: 10.0000 mg | ORAL_TABLET | Freq: Every day | ORAL | Status: DC

## 2014-02-26 NOTE — Progress Notes (Signed)
   Subjective:    Patient ID: Jordan Gallegos, female    DOB: January 15, 1961, 53 y.o.   MRN: [id number]  HPI Patient seen for complete physical. She sees gynecologist regularly. She has had some recent problems over the summer with right upper quadrant abdominal pain. Ultrasound revealed gallstones. She is scheduled for laparoscopic cholecystectomy next week. No recent active pain issues. She's never had screening colonoscopy. She had mammogram and Pap smear back in March of this year. Tetanus 2012. She declines flu vaccine.  Nonsmoker. Hypertension controlled with lisinopril.  Past Medical History  Diagnosis Date  . Asthma   . Chicken pox   . Frequent headaches   . Allergy   . Hypertension   . Urinary tract infection    No past surgical history on file.  reports that she has never smoked. She has never used smokeless tobacco. She reports that she does not drink alcohol or use illicit drugs. family history includes Arthritis in her mother; Hypertension in her father and mother; Parkinson's disease in her father. There is no history of Colon cancer or Stomach cancer. Allergies  Allergen Reactions  . Tylenol With Codeine #3 [Acetaminophen-Codeine] Nausea And Vomiting      Review of Systems  Constitutional: Negative for fever, activity change, appetite change, fatigue and unexpected weight change.  HENT: Negative for ear pain, hearing loss, sore throat and trouble swallowing.   Eyes: Negative for visual disturbance.  Respiratory: Negative for cough and shortness of breath.   Cardiovascular: Negative for chest pain and palpitations.  Gastrointestinal: Negative for abdominal pain, diarrhea, constipation and blood in stool.  Genitourinary: Negative for dysuria and hematuria.  Musculoskeletal: Negative for arthralgias, back pain and myalgias.  Skin: Negative for rash.  Neurological: Negative for dizziness, syncope and headaches.  Hematological: Negative for adenopathy.    Psychiatric/Behavioral: Negative for confusion and dysphoric mood.       Objective:   Physical Exam  Constitutional: She is oriented to person, place, and time. She appears well-developed and well-nourished.  HENT:  Head: Normocephalic and atraumatic.  Eyes: EOM are normal. Pupils are equal, round, and reactive to light.  Neck: Normal range of motion. Neck supple. No thyromegaly present.  Cardiovascular: Normal rate, regular rhythm and normal heart sounds.   No murmur heard. Pulmonary/Chest: Breath sounds normal. No respiratory distress. She has no wheezes. She has no rales.  Abdominal: Soft. Bowel sounds are normal. She exhibits no distension and no mass. There is no tenderness. There is no rebound and no guarding.  Musculoskeletal: Normal range of motion. She exhibits no edema.  Lymphadenopathy:    She has no cervical adenopathy.  Neurological: She is alert and oriented to person, place, and time. She displays normal reflexes. No cranial nerve deficit.  Skin: No rash noted.  Posterior left arm reveals small brown mole which is about 8 mm diameter. Minimal asymmetry  minimal color variegation. Fairly well demarcated border.  Psychiatric: She has a normal mood and affect. Her behavior is normal. Judgment and thought content normal.          Assessment & Plan:  Complete physical. Rectum and weight loss. Obtain screening labs. Tetanus up-to-date. Flu vaccine declined. Schedule screening colonoscopy. Continue regular GYN followup.  She has slightly atypical mole left posterior arm. We've recommended further evaluation with biopsy-she we'll schedule over the next 3-4 weeks after she's gone through her gallbladder surgery

## 2014-02-26 NOTE — Progress Notes (Signed)
Pre visit review using our clinic review tool, if applicable. No additional management support is needed unless otherwise documented below in the visit note. 

## 2014-02-26 NOTE — Patient Instructions (Signed)
Schedule screening colonoscopy. Try to lose some weight. Moles Moles are usually harmless growths on the skin. They are accumulations of color (pigment) cells in the skin that:   Can be various colors, from light brown to black.  Can appear anywhere on the body.  May remain flat or become raised.  May contain hairs.  May remain smooth or develop wrinkling. Most moles are not cancerous (benign). However, some moles may develop changes and become cancerous. It is important to check your moles every month. If you check your moles regularly, you will be able to notice any changes that may occur.  CAUSES  Moles occur when skin cells grow together in clusters instead of spreading out in the skin as they normally do. The reason for this clustering is unknown. DIAGNOSIS  Your caregiver will perform a skin examination to diagnose your mole.  TREATMENT  Moles usually do not require treatment. If a mole becomes worrisome, your caregiver may choose to take a sample of the mole or remove it entirely, and then send it to a lab for examination.  HOME CARE INSTRUCTIONS  Check your mole(s) monthly for changes that may indicate skin cancer. These changes can include:  A change in size.  A change in color. Note that moles tend to darken during pregnancy or when taking birth control pills (oral contraception).  A change in shape.  A change in the border of the mole.  Wear sunscreen (with an SPF of at least 65) when you spend long periods of time outside. Reapply the sunscreen every 2-3 hours.  Schedule annual appointments with your skin doctor (dermatologist) if you have a large number of moles. SEEK MEDICAL CARE IF:  Your mole changes size, especially if it becomes larger than a pencil eraser.  Your mole changes in color or develops more than one color.  Your mole becomes itchy or bleeds.  Your mole, or the skin near the mole, becomes painful, sore, red, or swollen.  Your mole becomes  scaly, sheds skin, or oozes fluid.  Your mole develops irregular borders.  Your mole becomes flat or develops raised areas.  Your mole becomes hard or soft. Document Released: 03/02/2001 Document Revised: 03/01/2012 Document Reviewed: 12/20/2011 [facility] Patient Information 2015 [place], [place]. This information is not intended to replace advice given to you by your health care provider. Make sure you discuss any questions you have with your health care provider.

## 2014-02-27 ENCOUNTER — Encounter: Payer: Self-pay | Admitting: Family Medicine

## 2014-03-01 ENCOUNTER — Encounter (INDEPENDENT_AMBULATORY_CARE_PROVIDER_SITE_OTHER): Payer: Self-pay | Admitting: Surgery

## 2014-03-05 ENCOUNTER — Other Ambulatory Visit (INDEPENDENT_AMBULATORY_CARE_PROVIDER_SITE_OTHER): Payer: Self-pay | Admitting: Surgery

## 2014-03-14 ENCOUNTER — Encounter (INDEPENDENT_AMBULATORY_CARE_PROVIDER_SITE_OTHER): Payer: Self-pay | Admitting: Surgery

## 2014-03-20 ENCOUNTER — Encounter (INDEPENDENT_AMBULATORY_CARE_PROVIDER_SITE_OTHER): Payer: BC Managed Care – PPO | Admitting: Surgery

## 2014-05-06 ENCOUNTER — Encounter: Payer: Self-pay | Admitting: Family Medicine

## 2014-05-06 ENCOUNTER — Ambulatory Visit (INDEPENDENT_AMBULATORY_CARE_PROVIDER_SITE_OTHER): Payer: BC Managed Care – PPO | Admitting: Family Medicine

## 2014-05-06 VITALS — BP 132/80 | HR 57 | Temp 98.0°F | Wt 189.0 lb

## 2014-05-06 DIAGNOSIS — D239 Other benign neoplasm of skin, unspecified: Secondary | ICD-10-CM

## 2014-05-06 DIAGNOSIS — D229 Melanocytic nevi, unspecified: Secondary | ICD-10-CM

## 2014-05-06 NOTE — Progress Notes (Signed)
Pre visit review using our clinic review tool, if applicable. No additional management support is needed unless otherwise documented below in the visit note. 

## 2014-05-06 NOTE — Progress Notes (Addendum)
   Subjective:    Patient ID: Jordan Gallegos, female    DOB: 1960/07/07, 53 y.o.   MRN: [id number]  HPI Patient seen with atypical type nevus left posterior arm. Recently noted incidentally on exam. She's never had any itching or bleeding. No personal or family history of skin cancer. She is not aware how long this pigmented lesion has been present.  Past Medical History  Diagnosis Date  . Asthma   . Chicken pox   . Frequent headaches   . Allergy   . Hypertension   . Urinary tract infection    No past surgical history on file.  reports that she has never smoked. She has never used smokeless tobacco. She reports that she does not drink alcohol or use illicit drugs. family history includes Arthritis in her mother; Hypertension in her father and mother; Parkinson's disease in her father. There is no history of Colon cancer or Stomach cancer. Allergies  Allergen Reactions  . Tylenol With Codeine #3 [Acetaminophen-Codeine] Nausea And Vomiting      Review of Systems  Constitutional: Negative for appetite change and unexpected weight change.  Hematological: Negative for adenopathy.       Objective:   Physical Exam  Constitutional: She appears well-developed and well-nourished.  Cardiovascular: Normal rate.   Skin:  Patient has pigmented lesion left posterior arm which is slightly asymmetric with minimal color variegation. She has fairly well demarcated border. About 8 mm diameter.          Assessment & Plan:  Atypical pigmented lesion left posterior arm. Probable dysplastic nevus. We've previously recommended excision for further evaluation and patient consented.  Discussed risks and benefits of shave excision including risks of bleeding, bruising, scar formation, and infection and patient consented to proceed.  Skin prepped with betadine and alcohol and shave excision of lesion with #15 blade with minimal bleeding.  Patient tolerated well.  Antibiotic and dressing   Applied.Specimen sent to pathology for further evaluation  Path=dysplastic nevus with moderate to severe atypia.  Pt notified and will set up derm referral and pt agrees with plan.

## 2014-05-06 NOTE — Patient Instructions (Signed)
Keep wound dry for the first 24 hours then clean daily with soap and water for one week. Apply topical antibiotic daily for 3-4 days. Keep covered with clean dressing for 4-5 days. Follow up promptly for any signs of infection such as redness, warmth, pain, or drainage.  

## 2014-05-13 ENCOUNTER — Telehealth: Payer: Self-pay | Admitting: Family Medicine

## 2014-05-13 NOTE — Telephone Encounter (Signed)
Pt would like a call back about her mole biopsy results

## 2014-05-13 NOTE — Addendum Note (Signed)
Addended by: Eulas Post on: 05/13/2014 07:50 PM   Modules accepted: Orders

## 2014-05-14 NOTE — Telephone Encounter (Signed)
Pt has been notified and derm referral made.

## 2014-05-15 ENCOUNTER — Encounter: Payer: Self-pay | Admitting: Family Medicine

## 2014-06-10 ENCOUNTER — Encounter: Payer: Self-pay | Admitting: Family Medicine

## 2014-11-26 ENCOUNTER — Telehealth: Payer: Self-pay

## 2014-11-26 NOTE — Telephone Encounter (Signed)
Left mess for pt to return call (concerning overdue mammogram)

## 2015-01-31 IMAGING — US US ABDOMEN COMPLETE
1 series · 14 of 25 positions shown · non-contrast
Comparison: None.

CLINICAL DATA: Epigastric pain.

EXAM:
ULTRASOUND ABDOMEN COMPLETE

[Series 1: us abdomen complete · 0.20mm/px · 14 of 89 slices shown]
[im 1/89]
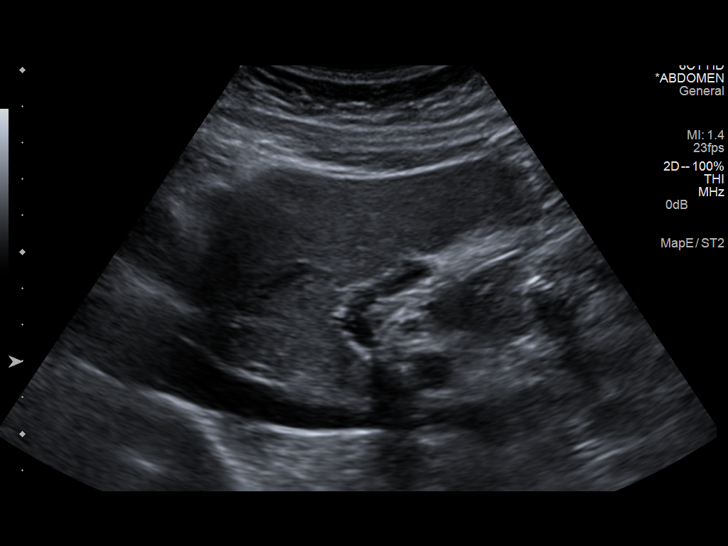
[im 8/89]
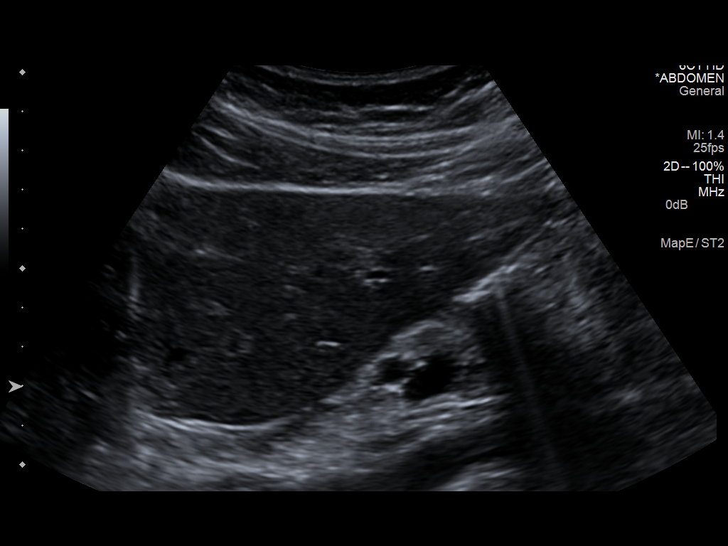
[im 15/89]
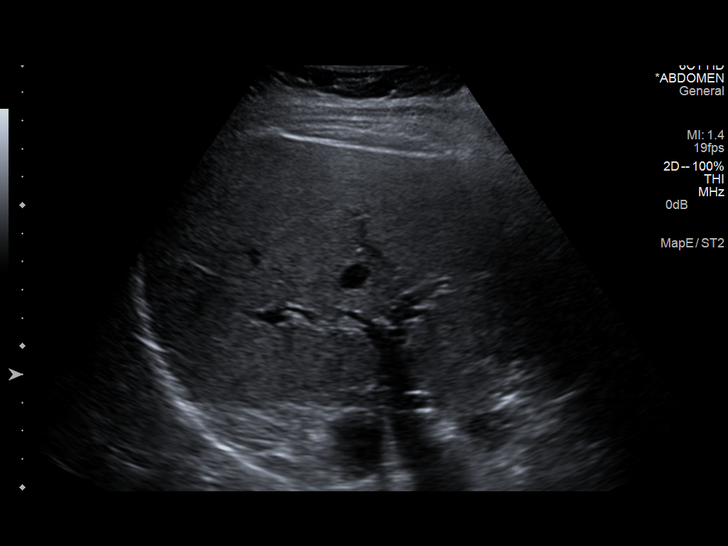
[im 23/89]
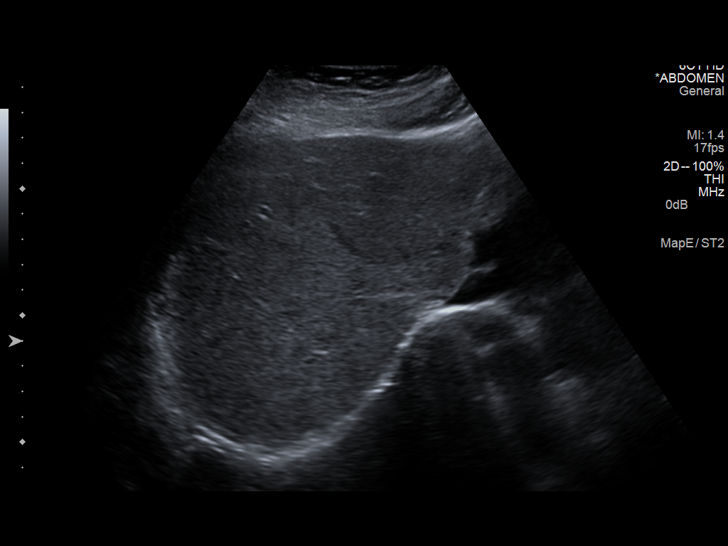
[im 30/89]
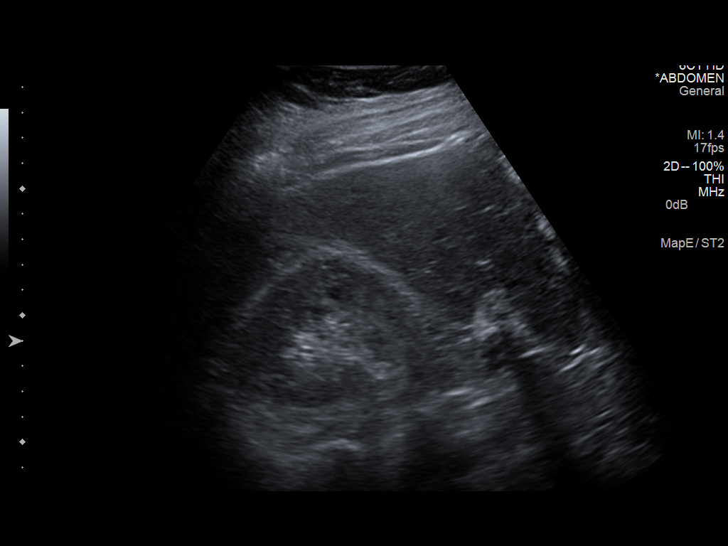
[im 34/89]
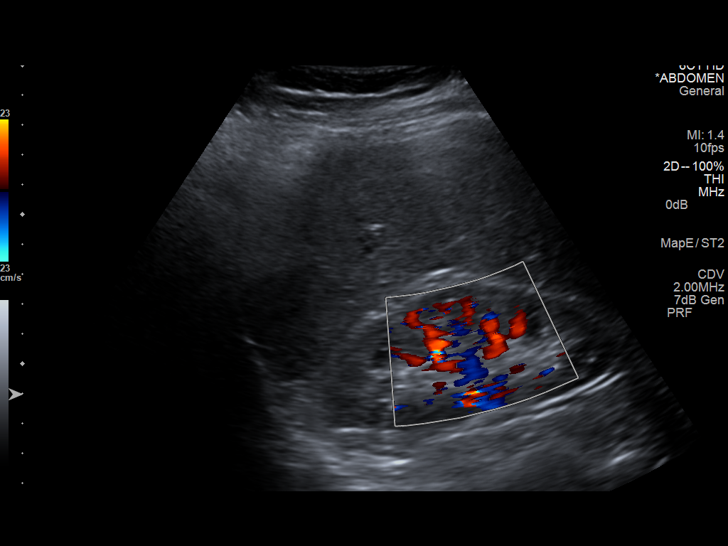
[im 41/89]
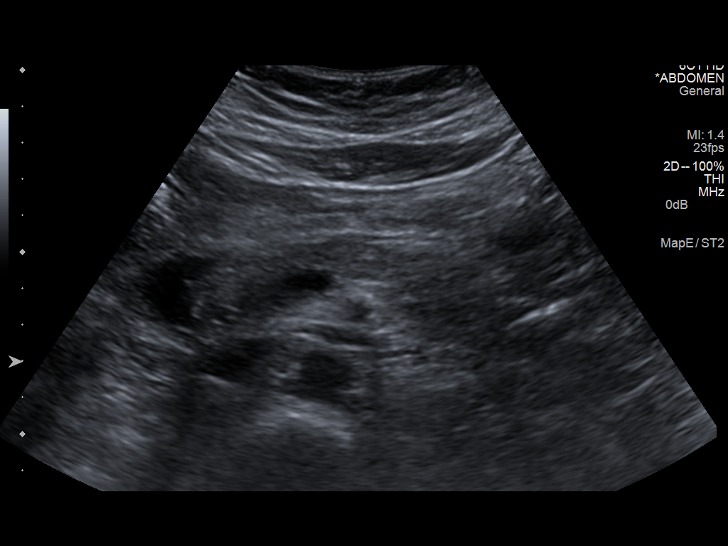
[im 48/89]
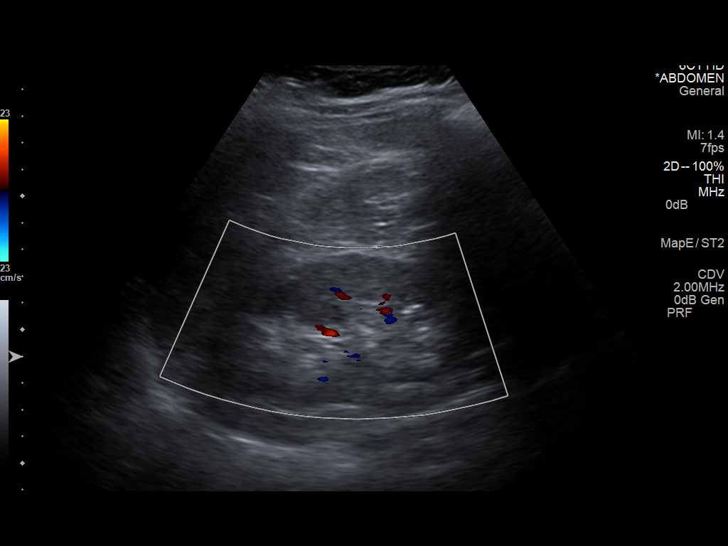
[im 56/89]
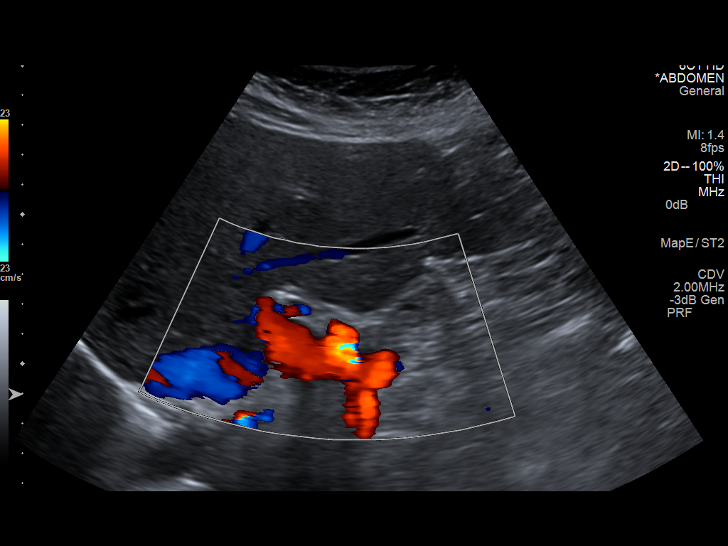
[im 59/89]
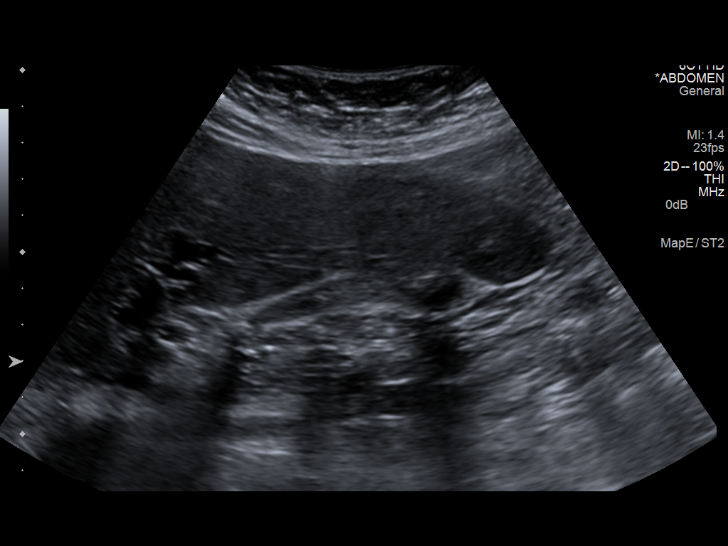
[im 67/89]
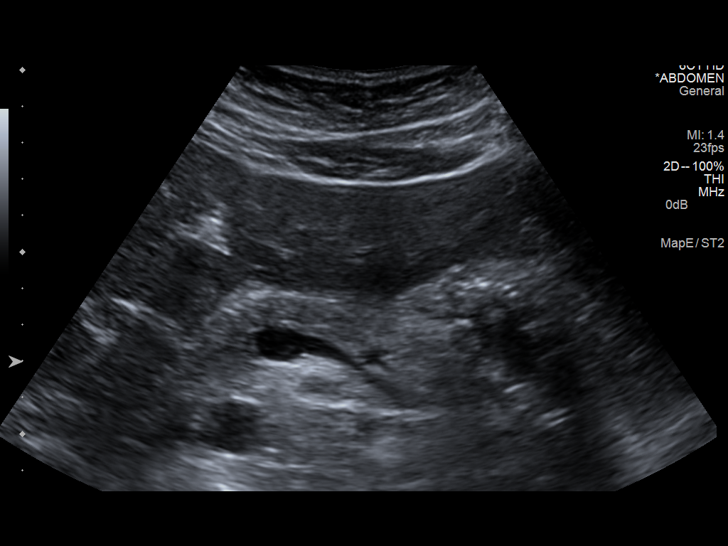
[im 74/89]
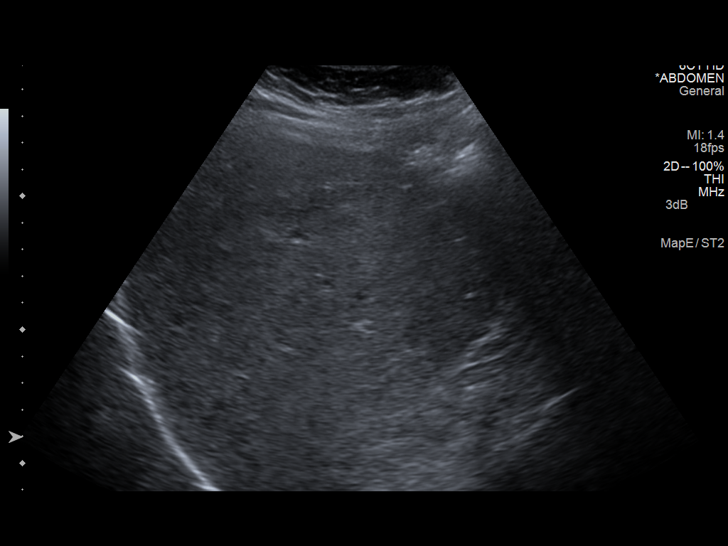
[im 81/89]
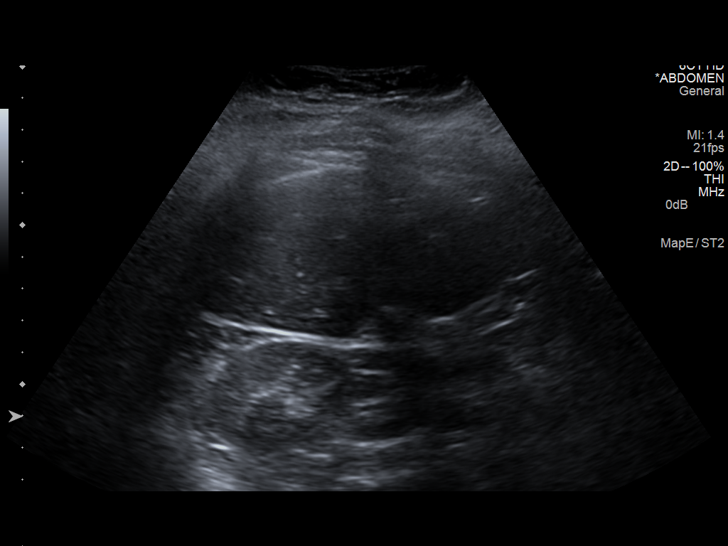
[im 89/89]
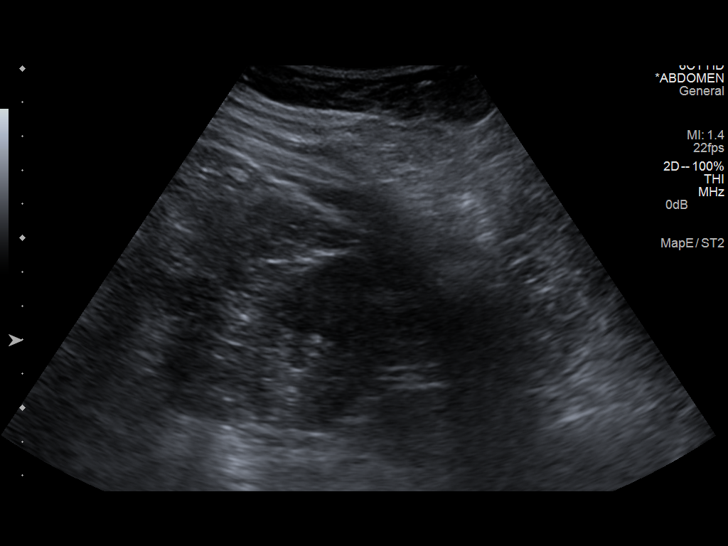

[14 of 25 positions shown; findings below may reference images not displayed]

FINDINGS: Gallbladder:

The gallbladder is contracted with small stones present. There is no
wall thickening or pericholecystic fluid. Sonographer reports
negative Murphy's sign.

Common bile duct:

Diameter: 0.3 cm.

Liver:

No focal lesion identified. Within normal limits in parenchymal
echogenicity.

IVC:

No abnormality visualized.

Pancreas:

Visualized portion unremarkable.

Spleen:

Size and appearance within normal limits.

Right Kidney:

Length: 11.6 cm. Echogenicity within normal limits. No mass or
hydronephrosis visualized.

Left Kidney:

Length: 12.1 cm. Echogenicity within normal limits. No mass or
hydronephrosis visualized.

Abdominal aorta:

No aneurysm visualized.

Other findings:

None.
IMPRESSION: The gallbladder is contracted with small stones but there is no
ultrasound evidence of acute cholecystitis. The examination is
otherwise negative.

## 2015-03-25 ENCOUNTER — Other Ambulatory Visit: Payer: Self-pay | Admitting: Radiology

## 2015-05-05 ENCOUNTER — Other Ambulatory Visit (INDEPENDENT_AMBULATORY_CARE_PROVIDER_SITE_OTHER): Payer: BLUE CROSS/BLUE SHIELD

## 2015-05-05 ENCOUNTER — Other Ambulatory Visit: Payer: Self-pay | Admitting: Family Medicine

## 2015-05-05 DIAGNOSIS — Z0000 Encounter for general adult medical examination without abnormal findings: Secondary | ICD-10-CM | POA: Diagnosis not present

## 2015-05-05 LAB — CBC WITH DIFFERENTIAL/PLATELET
BASOS ABS: 0.1 10*3/uL (ref 0.0–0.1)
BASOS PCT: 0.8 % (ref 0.0–3.0)
Eosinophils Absolute: 0.2 10*3/uL (ref 0.0–0.7)
Eosinophils Relative: 3.4 % (ref 0.0–5.0)
HEMATOCRIT: 47.7 % — AB (ref 36.0–46.0)
HEMOGLOBIN: 15.8 g/dL — AB (ref 12.0–15.0)
LYMPHS PCT: 40.6 % (ref 12.0–46.0)
Lymphs Abs: 2.7 10*3/uL (ref 0.7–4.0)
MCHC: 33.2 g/dL (ref 30.0–36.0)
MCV: 89.2 fl (ref 78.0–100.0)
MONO ABS: 0.4 10*3/uL (ref 0.1–1.0)
Monocytes Relative: 6.7 % (ref 3.0–12.0)
Neutro Abs: 3.2 10*3/uL (ref 1.4–7.7)
Neutrophils Relative %: 48.5 % (ref 43.0–77.0)
Platelets: 277 10*3/uL (ref 150.0–400.0)
RBC: 5.35 Mil/uL — AB (ref 3.87–5.11)
RDW: 14.1 % (ref 11.5–15.5)
WBC: 6.6 10*3/uL (ref 4.0–10.5)

## 2015-05-05 LAB — BASIC METABOLIC PANEL
BUN: 16 mg/dL (ref 6–23)
CALCIUM: 10 mg/dL (ref 8.4–10.5)
CO2: 27 mEq/L (ref 19–32)
CREATININE: 0.79 mg/dL (ref 0.40–1.20)
Chloride: 105 mEq/L (ref 96–112)
GFR: 80.88 mL/min (ref >60.00)
Glucose, Bld: 94 mg/dL (ref 70–99)
Potassium: 3.6 mEq/L (ref 3.5–5.1)
Sodium: 142 mEq/L (ref 135–145)

## 2015-05-05 LAB — LIPID PANEL
CHOL/HDL RATIO: 3
Cholesterol: 195 mg/dL (ref 0–200)
HDL: 61.5 mg/dL (ref >39.00)
LDL CALC: 113 mg/dL — AB (ref 0–99)
NonHDL: 133.02
TRIGLYCERIDES: 101 mg/dL (ref 0.0–149.0)
VLDL: 20.2 mg/dL (ref 0.0–40.0)

## 2015-05-05 LAB — HEPATIC FUNCTION PANEL
ALT: 20 U/L (ref 0–35)
AST: 15 U/L (ref 0–37)
Albumin: 4.7 g/dL (ref 3.5–5.2)
Alkaline Phosphatase: 73 U/L (ref 39–117)
BILIRUBIN DIRECT: 0.1 mg/dL (ref 0.0–0.3)
BILIRUBIN TOTAL: 0.6 mg/dL (ref 0.2–1.2)
TOTAL PROTEIN: 7.2 g/dL (ref 6.0–8.3)

## 2015-05-05 LAB — TSH: TSH: 2.09 u[IU]/mL (ref 0.35–4.50)

## 2015-05-12 ENCOUNTER — Ambulatory Visit (INDEPENDENT_AMBULATORY_CARE_PROVIDER_SITE_OTHER): Payer: BLUE CROSS/BLUE SHIELD | Admitting: Family Medicine

## 2015-05-12 ENCOUNTER — Encounter: Payer: Self-pay | Admitting: Family Medicine

## 2015-05-12 VITALS — BP 110/80 | HR 81 | Temp 98.0°F | Resp 16 | Ht 66.0 in | Wt 201.9 lb

## 2015-05-12 DIAGNOSIS — Z0000 Encounter for general adult medical examination without abnormal findings: Secondary | ICD-10-CM

## 2015-05-12 MED ORDER — LISINOPRIL 10 MG PO TABS: 10.0000 mg | ORAL_TABLET | Freq: Every day | ORAL | Status: DC

## 2015-05-12 NOTE — Progress Notes (Signed)
Pre visit review using our clinic review tool, if applicable. No additional management support is needed unless otherwise documented below in the visit note. 

## 2015-05-12 NOTE — Patient Instructions (Signed)
Try to lose some weight. Try to establish more consistent exercise.   Consider Cologuard If you change your mind about Colonoscopy let me know.

## 2015-05-12 NOTE — Progress Notes (Signed)
   Subjective:    Patient ID: Jordan Gallegos, female    DOB: May 21, 1961, 54 y.o.   MRN: [id number]  HPI   Patient seen for complete physical. She sees gynecologist regularly. She had dysplastic nevus left arm last year and was referred to dermatology and had further excision. No signs of recurrence. She is screened by them yearly. She's had some weight gain during the past year. Works very long days. She has refused colonoscopy. Also declines flu vaccine. Tetanus up-to-date. Blood pressure well controlled on lisinopril. Requesting refills.  Past Medical History  Diagnosis Date  . Asthma   . Chicken pox   . Frequent headaches   . Allergy   . Hypertension   . Urinary tract infection    No past surgical history on file.  reports that she has never smoked. She has never used smokeless tobacco. She reports that she does not drink alcohol or use illicit drugs. family history includes Arthritis in her mother; Hypertension in her father and mother; Parkinson's disease in her father. There is no history of Colon cancer or Stomach cancer. Allergies  Allergen Reactions  . Tylenol With Codeine #3 [Acetaminophen-Codeine] Nausea And Vomiting      Review of Systems  Constitutional: Negative for fever, activity change, appetite change, fatigue and unexpected weight change.  HENT: Negative for ear pain, hearing loss, sore throat and trouble swallowing.   Eyes: Negative for visual disturbance.  Respiratory: Negative for cough and shortness of breath.   Cardiovascular: Negative for chest pain and palpitations.  Gastrointestinal: Negative for abdominal pain, diarrhea, constipation and blood in stool.  Genitourinary: Negative for dysuria and hematuria.  Musculoskeletal: Negative for myalgias, back pain and arthralgias.  Skin: Negative for rash.  Neurological: Negative for dizziness, syncope and headaches.  Hematological: Negative for adenopathy.  Psychiatric/Behavioral: Negative for confusion and  dysphoric mood.       Objective:   Physical Exam  Constitutional: She is oriented to person, place, and time. She appears well-developed and well-nourished.  HENT:  Head: Normocephalic and atraumatic.  Eyes: EOM are normal. Pupils are equal, round, and reactive to light.  Neck: Normal range of motion. Neck supple. No thyromegaly present.  Cardiovascular: Normal rate, regular rhythm and normal heart sounds.   No murmur heard. Pulmonary/Chest: Breath sounds normal. No respiratory distress. She has no wheezes. She has no rales.  Abdominal: Soft. Bowel sounds are normal. She exhibits no distension and no mass. There is no tenderness. There is no rebound and no guarding.  Musculoskeletal: Normal range of motion. She exhibits no edema.  Lymphadenopathy:    She has no cervical adenopathy.  Neurological: She is alert and oriented to person, place, and time. She displays normal reflexes. No cranial nerve deficit.  Skin: No rash noted.  Psychiatric: She has a normal mood and affect. Her behavior is normal. Judgment and thought content normal.          Assessment & Plan:  Physical exam. Labs reviewed. No major concerns. Lipids improved from last year.  She will continue GYN follow-up. We suggested colonoscopy but she is not willing to schedule.  Discussed cologuard-and brochure given. She will check with insurance for coverage

## 2015-09-27 ENCOUNTER — Encounter: Payer: Self-pay | Admitting: Family Medicine

## 2015-10-01 ENCOUNTER — Encounter: Payer: Self-pay | Admitting: Family Medicine

## 2015-10-01 ENCOUNTER — Ambulatory Visit (INDEPENDENT_AMBULATORY_CARE_PROVIDER_SITE_OTHER): Payer: BLUE CROSS/BLUE SHIELD | Admitting: Family Medicine

## 2015-10-01 VITALS — BP 130/100 | HR 91 | Temp 98.7°F | Ht 66.0 in | Wt 196.0 lb

## 2015-10-01 DIAGNOSIS — B029 Zoster without complications: Secondary | ICD-10-CM | POA: Diagnosis not present

## 2015-10-01 MED ORDER — GABAPENTIN 300 MG PO CAPS: 300.0000 mg | ORAL_CAPSULE | Freq: Three times a day (TID) | ORAL | Status: DC

## 2015-10-01 MED ORDER — VALACYCLOVIR HCL 1 G PO TABS: 1000.0000 mg | ORAL_TABLET | Freq: Three times a day (TID) | ORAL | Status: DC

## 2015-10-01 NOTE — Patient Instructions (Signed)

## 2015-10-01 NOTE — Progress Notes (Signed)
   Subjective:    Patient ID: Jordan Gallegos, female    DOB: Dec 06, 1960, 55 y.o.   MRN: [id number]  HPI  patient seen with onset of rash about 4 days ago.  Rash is blistery with erythematous base and follows dermatome distribution left lower lumbar area.   Her pain somewhat waxes and wanes but is 7 out of 10 severity at its worst. She describes a burning and occasionally stabbing type pain area. She has used some Tylenol and Motrin with minimal relief. No fevers or chills.  Past Medical History  Diagnosis Date  . Asthma   . Chicken pox   . Frequent headaches   . Allergy   . Hypertension   . Urinary tract infection    Past Surgical History  Procedure Laterality Date  . Cholecystectomy      reports that she has never smoked. She has never used smokeless tobacco. She reports that she does not drink alcohol or use illicit drugs. family history includes Arthritis in her mother; Hypertension in her father and mother; Parkinson's disease in her father. There is no history of Colon cancer or Stomach cancer. Allergies  Allergen Reactions  . Tylenol With Codeine #3 [Acetaminophen-Codeine] Nausea And Vomiting      Review of Systems  Constitutional: Negative for fever and chills.  Skin: Positive for rash.       Objective:   Physical Exam  Constitutional: She appears well-developed and well-nourished.  Cardiovascular: Normal rate and regular rhythm.   Pulmonary/Chest: Effort normal and breath sounds normal. No respiratory distress. She has no wheezes. She has no rales.  Skin: Rash noted.  Patient has a large area of vesicular rash starting exactly in the midline left lower lumbar region and radiating all around to the front in a dermatome distribution. Erythematous base.          Assessment & Plan:   Shingles following left lower lumbar dermatomedistribution. She has fairly severe case.   Valtrex 1 g 3 times a day for 7 days. We discussed pain management. Start gabapentin 300 mg  daily at bedtime and gradually titrate if needed up to 1 3 times a day. We discussed local care. Follow-up promptly for any worsening pain or signs of secondary infection

## 2015-10-01 NOTE — Progress Notes (Signed)
Pre visit review using our clinic review tool, if applicable. No additional management support is needed unless otherwise documented below in the visit note. 

## 2015-10-07 ENCOUNTER — Encounter: Payer: Self-pay | Admitting: Family Medicine

## 2015-11-13 DIAGNOSIS — Z6832 Body mass index (BMI) 32.0-32.9, adult: Secondary | ICD-10-CM | POA: Diagnosis not present

## 2015-11-13 DIAGNOSIS — N911 Secondary amenorrhea: Secondary | ICD-10-CM | POA: Diagnosis not present

## 2015-11-13 DIAGNOSIS — Z01419 Encounter for gynecological examination (general) (routine) without abnormal findings: Secondary | ICD-10-CM | POA: Diagnosis not present

## 2015-11-25 DIAGNOSIS — Z09 Encounter for follow-up examination after completed treatment for conditions other than malignant neoplasm: Secondary | ICD-10-CM | POA: Diagnosis not present

## 2016-01-22 ENCOUNTER — Other Ambulatory Visit: Payer: Self-pay | Admitting: Family Medicine

## 2016-03-12 DIAGNOSIS — H1045 Other chronic allergic conjunctivitis: Secondary | ICD-10-CM | POA: Diagnosis not present

## 2016-03-12 DIAGNOSIS — J3081 Allergic rhinitis due to animal (cat) (dog) hair and dander: Secondary | ICD-10-CM | POA: Diagnosis not present

## 2016-03-12 DIAGNOSIS — J4520 Mild intermittent asthma, uncomplicated: Secondary | ICD-10-CM | POA: Diagnosis not present

## 2016-03-12 DIAGNOSIS — J3089 Other allergic rhinitis: Secondary | ICD-10-CM | POA: Diagnosis not present

## 2016-05-27 DIAGNOSIS — Z09 Encounter for follow-up examination after completed treatment for conditions other than malignant neoplasm: Secondary | ICD-10-CM | POA: Diagnosis not present

## 2016-08-06 ENCOUNTER — Other Ambulatory Visit: Payer: Self-pay | Admitting: Family Medicine

## 2016-11-16 DIAGNOSIS — N911 Secondary amenorrhea: Secondary | ICD-10-CM | POA: Diagnosis not present

## 2016-11-16 DIAGNOSIS — Z6833 Body mass index (BMI) 33.0-33.9, adult: Secondary | ICD-10-CM | POA: Diagnosis not present

## 2016-11-16 DIAGNOSIS — Z01419 Encounter for gynecological examination (general) (routine) without abnormal findings: Secondary | ICD-10-CM | POA: Diagnosis not present

## 2017-03-10 ENCOUNTER — Encounter: Payer: Self-pay | Admitting: Family Medicine

## 2017-03-11 DIAGNOSIS — J4520 Mild intermittent asthma, uncomplicated: Secondary | ICD-10-CM | POA: Diagnosis not present

## 2017-03-11 DIAGNOSIS — H1045 Other chronic allergic conjunctivitis: Secondary | ICD-10-CM | POA: Diagnosis not present

## 2017-03-11 DIAGNOSIS — J3089 Other allergic rhinitis: Secondary | ICD-10-CM | POA: Diagnosis not present

## 2017-03-11 DIAGNOSIS — J3081 Allergic rhinitis due to animal (cat) (dog) hair and dander: Secondary | ICD-10-CM | POA: Diagnosis not present

## 2017-04-25 ENCOUNTER — Ambulatory Visit (INDEPENDENT_AMBULATORY_CARE_PROVIDER_SITE_OTHER): Payer: BLUE CROSS/BLUE SHIELD | Admitting: Family Medicine

## 2017-04-25 ENCOUNTER — Encounter: Payer: Self-pay | Admitting: Family Medicine

## 2017-04-25 VITALS — BP 120/78 | HR 68 | Temp 98.7°F | Ht 66.0 in | Wt 204.3 lb

## 2017-04-25 DIAGNOSIS — Z0000 Encounter for general adult medical examination without abnormal findings: Secondary | ICD-10-CM

## 2017-04-25 LAB — CBC WITH DIFFERENTIAL/PLATELET
BASOS PCT: 1.1 % (ref 0.0–3.0)
Basophils Absolute: 0.1 10*3/uL (ref 0.0–0.1)
EOS PCT: 2.7 % (ref 0.0–5.0)
Eosinophils Absolute: 0.2 10*3/uL (ref 0.0–0.7)
HCT: 44.4 % (ref 36.0–46.0)
Hemoglobin: 14.7 g/dL (ref 12.0–15.0)
Lymphocytes Relative: 37.5 % (ref 12.0–46.0)
Lymphs Abs: 2.2 10*3/uL (ref 0.7–4.0)
MCHC: 33.1 g/dL (ref 30.0–36.0)
MCV: 90.3 fl (ref 78.0–100.0)
MONO ABS: 0.4 10*3/uL (ref 0.1–1.0)
Monocytes Relative: 6.9 % (ref 3.0–12.0)
NEUTROS ABS: 3 10*3/uL (ref 1.4–7.7)
Neutrophils Relative %: 51.8 % (ref 43.0–77.0)
Platelets: 290 10*3/uL (ref 150.0–400.0)
RBC: 4.92 Mil/uL (ref 3.87–5.11)
RDW: 13.5 % (ref 11.5–15.5)
WBC: 5.8 10*3/uL (ref 4.0–10.5)

## 2017-04-25 LAB — HEPATIC FUNCTION PANEL
ALT: 27 U/L (ref 0–35)
AST: 19 U/L (ref 0–37)
Albumin: 4.9 g/dL (ref 3.5–5.2)
Alkaline Phosphatase: 85 U/L (ref 39–117)
Bilirubin, Direct: 0.1 mg/dL (ref 0.0–0.3)
Total Bilirubin: 0.8 mg/dL (ref 0.2–1.2)
Total Protein: 7.2 g/dL (ref 6.0–8.3)

## 2017-04-25 LAB — LIPID PANEL
CHOLESTEROL: 222 mg/dL — AB (ref 0–200)
HDL: 70.6 mg/dL (ref >39.00)
LDL Cholesterol: 127 mg/dL — ABNORMAL HIGH (ref 0–99)
NONHDL: 151.77
TRIGLYCERIDES: 124 mg/dL (ref 0.0–149.0)
Total CHOL/HDL Ratio: 3
VLDL: 24.8 mg/dL (ref 0.0–40.0)

## 2017-04-25 LAB — BASIC METABOLIC PANEL
BUN: 16 mg/dL (ref 6–23)
CALCIUM: 10.4 mg/dL (ref 8.4–10.5)
CO2: 27 mEq/L (ref 19–32)
Chloride: 102 mEq/L (ref 96–112)
Creatinine, Ser: 0.76 mg/dL (ref 0.40–1.20)
GFR: 83.95 mL/min (ref >60.00)
Glucose, Bld: 89 mg/dL (ref 70–99)
Potassium: 4.1 mEq/L (ref 3.5–5.1)
Sodium: 140 mEq/L (ref 135–145)

## 2017-04-25 LAB — TSH: TSH: 1.97 u[IU]/mL (ref 0.35–4.50)

## 2017-04-25 NOTE — Patient Instructions (Signed)
Consider either colonoscopy screening or Cologuard- let us know if interested in Cologuard after checking with insurance.    Set up for nevus biopsy left shoulder.

## 2017-04-25 NOTE — Progress Notes (Signed)
Subjective:     Patient ID: Jordan Gallegos, female   DOB: Oct 03, 1960, 56 y.o.   MRN: [id number]  HPI Patient is seen for complete physical. She sees gynecologist yearly is getting Pap smears every 3 years. She has never had colon cancer screening and has declined colonoscopies in the past. No history of hepatitis C screening. Low risk. She has hypertension treated with lisinopril. She declines flu vaccinations. Tetanus is up-to-date. She had clinical case of shingles about a year ago.  Past Medical History:  Diagnosis Date  . Allergy   . Asthma   . Chicken pox   . Frequent headaches   . Hypertension   . Urinary tract infection    Past Surgical History:  Procedure Laterality Date  . CHOLECYSTECTOMY      reports that  has never smoked. she has never used smokeless tobacco. She reports that she does not drink alcohol or use drugs. family history includes Arthritis in her mother; Hypertension in her father and mother; Parkinson's disease in her father. Allergies  Allergen Reactions  . Tylenol With Codeine #3 [Acetaminophen-Codeine] Nausea And Vomiting     Review of Systems  Constitutional: Negative for activity change, appetite change, fatigue, fever and unexpected weight change.  HENT: Negative for ear pain, hearing loss, sore throat and trouble swallowing.   Eyes: Negative for visual disturbance.  Respiratory: Negative for cough and shortness of breath.   Cardiovascular: Negative for chest pain and palpitations.  Gastrointestinal: Negative for abdominal pain, blood in stool, constipation and diarrhea.  Endocrine: Negative for polydipsia and polyuria.  Genitourinary: Negative for dysuria and hematuria.  Musculoskeletal: Negative for arthralgias, back pain and myalgias.  Skin: Negative for rash.  Neurological: Negative for dizziness, syncope and headaches.  Hematological: Negative for adenopathy.  Psychiatric/Behavioral: Negative for confusion and dysphoric mood.        Objective:   Physical Exam  Constitutional: She is oriented to person, place, and time. She appears well-developed and well-nourished.  HENT:  Head: Normocephalic and atraumatic.  Eyes: EOM are normal. Pupils are equal, round, and reactive to light.  Neck: Normal range of motion. Neck supple. No thyromegaly present.  Cardiovascular: Normal rate, regular rhythm and normal heart sounds.  No murmur heard. Pulmonary/Chest: Breath sounds normal. No respiratory distress. She has no wheezes. She has no rales.  Abdominal: Soft. Bowel sounds are normal. She exhibits no distension and no mass. There is no tenderness. There is no rebound and no guarding.  Musculoskeletal: Normal range of motion. She exhibits no edema.  Lymphadenopathy:    She has no cervical adenopathy.  Neurological: She is alert and oriented to person, place, and time. She displays normal reflexes. No cranial nerve deficit.  Skin: No rash noted.  She has several nevi on her trunk and also several seborrheic keratosis. She has one nevus left shoulder which is only about 3 mm diameter but is very dark in color and slight color variegation.  Psychiatric: She has a normal mood and affect. Her behavior is normal. Judgment and thought content normal.       Assessment:     Physical exam. Several issues addressed as below    Plan:     -Check labs and include hepatitis C antibody -We've highly advised colon cancer screening. She declines colonoscopy but she will consider DNA base stool testing. She was given information and will check with insurance coverage first -Set up follow-up for removal by shave biopsy dark nevus left shoulder -Offered flu vaccine and  she declines. -She plans to continue with GYN follow-up  Eulas Post MD [facility]

## 2017-04-26 ENCOUNTER — Encounter: Payer: Self-pay | Admitting: Family Medicine

## 2017-04-26 LAB — HEPATITIS C ANTIBODY
HEP C AB: NONREACTIVE
SIGNAL TO CUT-OFF: 0.01 (ref <1.00)

## 2017-05-06 ENCOUNTER — Encounter: Payer: Self-pay | Admitting: Family Medicine

## 2017-05-06 ENCOUNTER — Ambulatory Visit: Payer: BLUE CROSS/BLUE SHIELD | Admitting: Family Medicine

## 2017-05-06 VITALS — BP 110/70 | HR 77 | Temp 98.3°F | Wt 203.0 lb

## 2017-05-06 DIAGNOSIS — D2262 Melanocytic nevi of left upper limb, including shoulder: Secondary | ICD-10-CM | POA: Diagnosis not present

## 2017-05-06 DIAGNOSIS — L821 Other seborrheic keratosis: Secondary | ICD-10-CM | POA: Diagnosis not present

## 2017-05-06 DIAGNOSIS — L819 Disorder of pigmentation, unspecified: Secondary | ICD-10-CM

## 2017-05-06 NOTE — Progress Notes (Signed)
Subjective:     Patient ID: Jordan Gallegos, female   DOB: 03-Apr-1961, 56 y.o.   MRN: [id number]  HPI Patient here for nevus excision of left shoulder. This is a procedure only visit.  Nevus noted incidentally on recent exam. She's had prior history of dysplastic nevus. She's not had any recent itching or bleeding.  Past Medical History:  Diagnosis Date  . Allergy   . Asthma   . Chicken pox   . Frequent headaches   . Hypertension   . Urinary tract infection    Past Surgical History:  Procedure Laterality Date  . CHOLECYSTECTOMY      reports that  has never smoked. she has never used smokeless tobacco. She reports that she does not drink alcohol or use drugs. family history includes Arthritis in her mother; Hypertension in her father and mother; Parkinson's disease in her father. Allergies  Allergen Reactions  . Tylenol With Codeine #3 [Acetaminophen-Codeine] Nausea And Vomiting     Review of Systems  Constitutional: Negative for appetite change and unexpected weight change.  Hematological: Negative for adenopathy.       Objective:   Physical Exam  Skin:  Patient has dark colored nevus left shoulder with slight irregularity around the border and slight color variegation. Approximately 6 mm diameter.       Assessment:     Slightly atypical pigmented lesion left shoulder. Rule out dysplastic nevus versus seborrheic keratosis versus other    Plan:     Discussed risks and benefits of shave excision including risks of bleeding, bruising, scar formation, and infection and patient consented to proceed.  Skin prepped with betadine and alcohol and shave excision of lesion with #15 blade with minimal bleeding.  Patient tolerated well.  Antibiotic and dressing  applied.  Specimen sent to: Pathology for further evaluation  Eulas Post MD [facility]

## 2017-05-06 NOTE — Patient Instructions (Signed)
Keep wound dry for the first 24 hours then clean daily with soap and water for one week. Apply topical vaseline daily for 3-4 days. Keep covered with clean dressing for 4-5 days. Follow up promptly for any signs of infection such as redness, warmth, pain, or drainage.   

## 2017-05-09 ENCOUNTER — Telehealth: Payer: Self-pay | Admitting: Family Medicine

## 2017-05-09 DIAGNOSIS — Z1212 Encounter for screening for malignant neoplasm of rectum: Secondary | ICD-10-CM | POA: Diagnosis not present

## 2017-05-09 DIAGNOSIS — Z1211 Encounter for screening for malignant neoplasm of colon: Secondary | ICD-10-CM | POA: Diagnosis not present

## 2017-05-09 LAB — COLOGUARD: Cologuard: NEGATIVE

## 2017-05-09 NOTE — Telephone Encounter (Signed)
Missy C;ark called for [facility] for information where pt's skin biopsy was taken. Information taken from [place] 05/06/17 : biopsy taken from left shoulder.

## 2017-05-19 ENCOUNTER — Other Ambulatory Visit: Payer: Self-pay | Admitting: Family Medicine

## 2017-06-03 ENCOUNTER — Ambulatory Visit: Payer: BLUE CROSS/BLUE SHIELD | Admitting: Family Medicine

## 2017-06-03 ENCOUNTER — Encounter: Payer: Self-pay | Admitting: Family Medicine

## 2017-06-03 VITALS — BP 110/80 | HR 77 | Temp 99.0°F | Wt 201.4 lb

## 2017-06-03 DIAGNOSIS — L819 Disorder of pigmentation, unspecified: Secondary | ICD-10-CM

## 2017-06-03 DIAGNOSIS — L918 Other hypertrophic disorders of the skin: Secondary | ICD-10-CM

## 2017-06-03 DIAGNOSIS — L820 Inflamed seborrheic keratosis: Secondary | ICD-10-CM

## 2017-06-03 MED ORDER — LISINOPRIL 10 MG PO TABS: 10.0000 mg | ORAL_TABLET | Freq: Every day | ORAL | 3 refills | Status: DC

## 2017-06-03 NOTE — Progress Notes (Signed)
Subjective:     Patient ID: Jordan Gallegos, female   DOB: [birth year], 56 y.o.   MRN: [id number]  HPI Patient is here to have some skin lesions removed. This is a procedure only visit.  She has some irritated skin tags involving posterior neck and left side of neck. She also has somewhat elongated brownish well-demarcated symmetric lesion left side of neck. No itching or bleeding. No personal history of skin cancer.  Past Medical History:  Diagnosis Date  . Allergy   . Asthma   . Chicken pox   . Frequent headaches   . Hypertension   . Urinary tract infection    Past Surgical History:  Procedure Laterality Date  . CHOLECYSTECTOMY      reports that  has never smoked. she has never used smokeless tobacco. She reports that she does not drink alcohol or use drugs. family history includes Arthritis in her mother; Hypertension in her father and mother; Parkinson's disease in her father. Allergies  Allergen Reactions  . Tylenol With Codeine #3 [Acetaminophen-Codeine] Nausea And Vomiting     Review of Systems  Hematological: Negative for adenopathy.       Objective:   Physical Exam  Constitutional: She appears well-developed and well-nourished.  Skin:  Patient has some classic benign-appearing skin tags including couple larger ones left side of neck and posterior neck region. She also has left side of neck elongated brownish symmetric skin lesion which is approximately 8 mm in length       Assessment:     #1 benign-appearing skin tags including one posterior neck and two on left side of neck  #2 benign-appearing symmetric pigmented lesion which is irritated because of location and size-?seborrheic keratosis vs nevus.      Plan:     -We discussed risk and benefits of shave excision of 4 lesions above. We discussed risk including bleeding, infection, scarring and patient consented to proceed. All 4 lesions were anesthetized with 1% Xylocaine with epinephrine. Shave excision with  #15 blade. Minimal bleeding controlled with silver nitrate. Topical Vaseline and topical dressing applied -pigmented skin lesion left neck was sent to [facility] for further assessment. -wound care instruction given.  Eulas Post MD [facility] at [facility]

## 2017-06-03 NOTE — Patient Instructions (Signed)
Keep wound dry for the first 24 hours then clean daily with soap and water for one week. Apply topical vaseline daily for 3-4 days. Keep covered with clean dressing for 4-5 days. Follow up promptly for any signs of infection such as redness, warmth, pain, or drainage.   

## 2017-06-08 DIAGNOSIS — Z1231 Encounter for screening mammogram for malignant neoplasm of breast: Secondary | ICD-10-CM | POA: Diagnosis not present

## 2017-06-19 ENCOUNTER — Encounter: Payer: Self-pay | Admitting: Family Medicine

## 2017-08-19 ENCOUNTER — Encounter: Payer: Self-pay | Admitting: Family Medicine

## 2017-08-19 NOTE — Telephone Encounter (Signed)
Copied from [place]. Topic: General - Other >> Aug 19, 2017  1:49 PM Yvette Rack wrote: Reason for CRM: patient calling wanting to see if anyone else can write a RX for the flu her husband has the flu

## 2017-08-19 NOTE — Telephone Encounter (Signed)
[id number] - [place], [place] (Phone) [phone] (Fax)   Pt calling to verify if her MyChart message had be recv'd.

## 2018-02-28 DIAGNOSIS — J029 Acute pharyngitis, unspecified: Secondary | ICD-10-CM | POA: Diagnosis not present

## 2018-02-28 DIAGNOSIS — R509 Fever, unspecified: Secondary | ICD-10-CM | POA: Diagnosis not present

## 2018-03-10 DIAGNOSIS — J3081 Allergic rhinitis due to animal (cat) (dog) hair and dander: Secondary | ICD-10-CM | POA: Diagnosis not present

## 2018-03-10 DIAGNOSIS — H1045 Other chronic allergic conjunctivitis: Secondary | ICD-10-CM | POA: Diagnosis not present

## 2018-03-10 DIAGNOSIS — J3089 Other allergic rhinitis: Secondary | ICD-10-CM | POA: Diagnosis not present

## 2018-03-10 DIAGNOSIS — J4520 Mild intermittent asthma, uncomplicated: Secondary | ICD-10-CM | POA: Diagnosis not present

## 2018-05-14 ENCOUNTER — Other Ambulatory Visit: Payer: Self-pay | Admitting: Family Medicine

## 2018-06-10 DIAGNOSIS — Z1231 Encounter for screening mammogram for malignant neoplasm of breast: Secondary | ICD-10-CM | POA: Diagnosis not present

## 2018-09-08 ENCOUNTER — Other Ambulatory Visit: Payer: Self-pay | Admitting: Family Medicine

## 2018-09-10 ENCOUNTER — Other Ambulatory Visit: Payer: Self-pay | Admitting: Family Medicine

## 2018-09-12 NOTE — Telephone Encounter (Signed)
Patient has been scheduled a WebEx appt for 09/13/18 at 7:45am for medication refill.

## 2018-09-12 NOTE — Telephone Encounter (Signed)
Patient has not been seen since December 2018  Please advise if OK to fill or have patient set up a WebEx or Telephone appointment.

## 2018-09-12 NOTE — Telephone Encounter (Signed)
webex- if possible.

## 2018-09-13 ENCOUNTER — Other Ambulatory Visit: Payer: Self-pay

## 2018-09-13 ENCOUNTER — Ambulatory Visit (INDEPENDENT_AMBULATORY_CARE_PROVIDER_SITE_OTHER): Payer: BLUE CROSS/BLUE SHIELD | Admitting: Family Medicine

## 2018-09-13 DIAGNOSIS — I10 Essential (primary) hypertension: Secondary | ICD-10-CM

## 2018-09-13 MED ORDER — LISINOPRIL 10 MG PO TABS
10.0000 mg | ORAL_TABLET | Freq: Every day | ORAL | 3 refills | Status: DC
Start: 2018-09-13 — End: 2019-11-02

## 2018-09-13 NOTE — Progress Notes (Signed)
Patient ID: Jordan Gallegos, female   DOB: 11/09/1960, 58 y.o.   MRN: [id number]  Virtual Visit via Telephone Note  I connected with  on 09/13/18 at  7:45 AM EDT by telephone and verified that I am speaking with the correct person using two identifiers.   I discussed the limitations, risks, security and privacy concerns of performing an evaluation and management service by telephone and the availability of in person appointments. I also discussed with the patient that there may be a patient responsible charge related to this service. The patient expressed understanding and agreed to proceed.  Location patient: home Location provider: work or home office Participants present for the call: patient, provider Patient did not have a visit in the prior 7 days to address this/these issue(s).   History of Present Illness: Patient has hypertension and takes lisinopril 10 mg daily.  Requesting refill of medications.  She has checked her blood pressure several times recently and has had readings consistently around 130/70 or less.  No headaches.  No dizziness.  No chest pains.  She has history of mild intermittent asthma which is been very stable.  She stays on Singulair.  She is overdue for labs and will plan to consider follow-up for those after current pandemic crisis has resolved  Past Medical History:  Diagnosis Date  . Allergy   . Asthma   . Chicken pox   . Frequent headaches   . Hypertension   . Urinary tract infection    Past Surgical History:  Procedure Laterality Date  . CHOLECYSTECTOMY      reports that she has never smoked. She has never used smokeless tobacco. She reports that she does not drink alcohol or use drugs. family history includes Arthritis in her mother; Hypertension in her father and mother; Parkinson's disease in her father. Allergies  Allergen Reactions  . Tylenol With Codeine #3 [Acetaminophen-Codeine] Nausea And Vomiting      Observations/Objective: Patient  sounds cheerful and well on the phone. I do not appreciate any SOB. Speech and thought processing are grossly intact. Patient reported vitals:  Assessment and Plan:  Hypertension.  Well-controlled  Follow Up Instructions:  -Refill lisinopril for 1 year   99441 5-10 99442 11-20 9443 21-30 I did not refer this patient for an OV in the next 24 hours for this/these issue(s).  I discussed the assessment and treatment plan with the patient. The patient was provided an opportunity to ask questions and all were answered. The patient agreed with the plan and demonstrated an understanding of the instructions.   The patient was advised to call back or seek an in-person evaluation if the symptoms worsen or if the condition fails to improve as anticipated.  I provided 8 minutes of non-face-to-face time during this encounter.   Carolann Littler, MD

## 2019-05-08 DIAGNOSIS — Z01419 Encounter for gynecological examination (general) (routine) without abnormal findings: Secondary | ICD-10-CM | POA: Diagnosis not present

## 2019-05-08 DIAGNOSIS — Z1151 Encounter for screening for human papillomavirus (HPV): Secondary | ICD-10-CM | POA: Diagnosis not present

## 2019-05-08 DIAGNOSIS — Z6831 Body mass index (BMI) 31.0-31.9, adult: Secondary | ICD-10-CM | POA: Diagnosis not present

## 2019-05-16 DIAGNOSIS — J3089 Other allergic rhinitis: Secondary | ICD-10-CM | POA: Diagnosis not present

## 2019-05-16 DIAGNOSIS — J4520 Mild intermittent asthma, uncomplicated: Secondary | ICD-10-CM | POA: Diagnosis not present

## 2019-05-16 DIAGNOSIS — J3081 Allergic rhinitis due to animal (cat) (dog) hair and dander: Secondary | ICD-10-CM | POA: Diagnosis not present

## 2019-05-16 DIAGNOSIS — H1045 Other chronic allergic conjunctivitis: Secondary | ICD-10-CM | POA: Diagnosis not present

## 2019-06-12 DIAGNOSIS — Z1231 Encounter for screening mammogram for malignant neoplasm of breast: Secondary | ICD-10-CM | POA: Diagnosis not present

## 2019-06-12 LAB — HM MAMMOGRAPHY

## 2019-06-13 ENCOUNTER — Encounter: Payer: BLUE CROSS/BLUE SHIELD | Admitting: Family Medicine

## 2019-06-25 ENCOUNTER — Ambulatory Visit (INDEPENDENT_AMBULATORY_CARE_PROVIDER_SITE_OTHER): Payer: BLUE CROSS/BLUE SHIELD | Admitting: Family Medicine

## 2019-06-25 ENCOUNTER — Encounter: Payer: Self-pay | Admitting: Family Medicine

## 2019-06-25 ENCOUNTER — Other Ambulatory Visit: Payer: Self-pay

## 2019-06-25 VITALS — BP 114/80 | HR 62 | Temp 97.7°F | Ht 65.0 in | Wt 190.8 lb

## 2019-06-25 DIAGNOSIS — Z0000 Encounter for general adult medical examination without abnormal findings: Secondary | ICD-10-CM | POA: Diagnosis not present

## 2019-06-25 LAB — CBC WITH DIFFERENTIAL/PLATELET
Basophils Absolute: 0.1 10*3/uL (ref 0.0–0.1)
Basophils Relative: 1.4 % (ref 0.0–3.0)
Eosinophils Absolute: 0.2 10*3/uL (ref 0.0–0.7)
Eosinophils Relative: 3.3 % (ref 0.0–5.0)
HCT: 44.3 % (ref 36.0–46.0)
Hemoglobin: 14.7 g/dL (ref 12.0–15.0)
Lymphocytes Relative: 36.2 % (ref 12.0–46.0)
Lymphs Abs: 1.9 10*3/uL (ref 0.7–4.0)
MCHC: 33.2 g/dL (ref 30.0–36.0)
MCV: 90.4 fl (ref 78.0–100.0)
Monocytes Absolute: 0.3 10*3/uL (ref 0.1–1.0)
Monocytes Relative: 6.5 % (ref 3.0–12.0)
Neutro Abs: 2.8 10*3/uL (ref 1.4–7.7)
Neutrophils Relative %: 52.6 % (ref 43.0–77.0)
Platelets: 264 10*3/uL (ref 150.0–400.0)
RBC: 4.9 Mil/uL (ref 3.87–5.11)
RDW: 13.8 % (ref 11.5–15.5)
WBC: 5.3 10*3/uL (ref 4.0–10.5)

## 2019-06-25 LAB — HEPATIC FUNCTION PANEL
ALT: 18 U/L (ref 0–35)
AST: 19 U/L (ref 0–37)
Albumin: 4.9 g/dL (ref 3.5–5.2)
Alkaline Phosphatase: 77 U/L (ref 39–117)
Bilirubin, Direct: 0.1 mg/dL (ref 0.0–0.3)
Total Bilirubin: 0.5 mg/dL (ref 0.2–1.2)
Total Protein: 7 g/dL (ref 6.0–8.3)

## 2019-06-25 LAB — LIPID PANEL
Cholesterol: 225 mg/dL — ABNORMAL HIGH (ref 0–200)
HDL: 64.6 mg/dL (ref >39.00)
LDL Cholesterol: 130 mg/dL — ABNORMAL HIGH (ref 0–99)
NonHDL: 159.92
Total CHOL/HDL Ratio: 3
Triglycerides: 152 mg/dL — ABNORMAL HIGH (ref 0.0–149.0)
VLDL: 30.4 mg/dL (ref 0.0–40.0)

## 2019-06-25 LAB — BASIC METABOLIC PANEL
BUN: 13 mg/dL (ref 6–23)
CO2: 27 mEq/L (ref 19–32)
Calcium: 10.1 mg/dL (ref 8.4–10.5)
Chloride: 102 mEq/L (ref 96–112)
Creatinine, Ser: 0.7 mg/dL (ref 0.40–1.20)
GFR: 86.17 mL/min (ref >60.00)
Glucose, Bld: 90 mg/dL (ref 70–99)
Potassium: 4.5 mEq/L (ref 3.5–5.1)
Sodium: 140 mEq/L (ref 135–145)

## 2019-06-25 LAB — TSH: TSH: 2.93 u[IU]/mL (ref 0.35–4.50)

## 2019-06-25 NOTE — Progress Notes (Signed)
  Subjective:     Patient ID: Jordan Gallegos, female   DOB: Jan 29, 1961, 59 y.o.   MRN: [id number]  HPI   Jordan Gallegos is seen for physical exam.  She sees gynecologist yearly.  She has hypertension treated with lisinopril 10 mg daily.  She has lost about 13 or 14 pounds from last year due to her efforts.  She is exercising more consistently.  She has more time because of working from home and not having to commute.  She is also made some dietary changes.  Feels good overall.  She is getting regular Pap smears through gynecology.  She did Cologuard 2018 which was negative.  Tetanus is up-to-date.  She declines flu vaccine.  Wt Readings from Last 3 Encounters:  06/25/19 190 lb 12.8 oz (86.5 kg)  06/03/17 201 lb 6.4 oz (91.4 kg)  05/06/17 203 lb (92.1 kg)     Past Medical History:  Diagnosis Date  . Allergy   . Asthma   . Chicken pox   . Frequent headaches   . Hypertension   . Urinary tract infection    Past Surgical History:  Procedure Laterality Date  . CHOLECYSTECTOMY      reports that she has never smoked. She has never used smokeless tobacco. She reports that she does not drink alcohol or use drugs. family history includes Arthritis in her mother; Hypertension in her father and mother; Parkinson's disease in her father. Allergies  Allergen Reactions  . Tylenol With Codeine #3 [Acetaminophen-Codeine] Nausea And Vomiting  . Codeine Other (See Comments)    Tylenol with Codeine (Vomiting)     Review of Systems  Constitutional: Negative for appetite change, chills, fatigue, fever and unexpected weight change.  Eyes: Negative for visual disturbance.  Respiratory: Negative for cough, chest tightness, shortness of breath and wheezing.   Cardiovascular: Negative for chest pain, palpitations and leg swelling.  Endocrine: Negative for polydipsia and polyuria.  Neurological: Negative for dizziness, seizures, syncope, weakness, light-headedness and headaches.       Objective:   Physical Exam Constitutional:      Appearance: She is well-developed.  Eyes:     Pupils: Pupils are equal, round, and reactive to light.  Neck:     Thyroid: No thyromegaly.     Vascular: No JVD.  Cardiovascular:     Rate and Rhythm: Normal rate and regular rhythm.     Heart sounds: No gallop.   Pulmonary:     Effort: Pulmonary effort is normal. No respiratory distress.     Breath sounds: Normal breath sounds. No wheezing or rales.  Musculoskeletal:     Cervical back: Neck supple.     Right lower leg: No edema.     Left lower leg: No edema.  Neurological:     Mental Status: She is alert.        Assessment:     Physical exam.  She has hypertension which is well controlled.  We discussed the following health maintenance issues    Plan:     -Obtain follow-up screening lab work -Continue weight loss efforts -She will plan to continue with GYN follow-up for her mammograms and Pap smears -Offered flu vaccine and she declines  Eulas Post MD [facility] at [facility]

## 2019-06-25 NOTE — Patient Instructions (Signed)

## 2019-07-06 ENCOUNTER — Other Ambulatory Visit: Payer: Self-pay

## 2019-07-09 ENCOUNTER — Other Ambulatory Visit: Payer: Self-pay

## 2019-07-09 ENCOUNTER — Encounter: Payer: Self-pay | Admitting: Family Medicine

## 2019-07-09 ENCOUNTER — Ambulatory Visit (INDEPENDENT_AMBULATORY_CARE_PROVIDER_SITE_OTHER): Payer: BLUE CROSS/BLUE SHIELD | Admitting: Family Medicine

## 2019-07-09 VITALS — BP 112/72 | HR 60 | Temp 97.2°F | Ht 65.0 in | Wt 192.3 lb

## 2019-07-09 DIAGNOSIS — L918 Other hypertrophic disorders of the skin: Secondary | ICD-10-CM | POA: Diagnosis not present

## 2019-07-09 NOTE — Patient Instructions (Signed)
Keep wounds dry for the first 24 hours then clean daily with soap and water for one week. Apply vaseline daily for 5-6 days. Keep covered with clean dressing for 5-6 days. Follow up promptly for any signs of infection such as redness, warmth, pain, or drainage.

## 2019-07-09 NOTE — Progress Notes (Signed)
  Subjective:     Patient ID: Jordan Gallegos, female   DOB: May 07, 1961, 59 y.o.   MRN: [id number]  HPI   Patient here for procedure only visit.  She had recent physical and we had discussed bring her back for procedures below:   Jordan Gallegos is seen has several skin lesions removed.  She was seen recently for physical.  She had a couple of large skin tags on her posterior thighs as well as right axillary region and several smaller ones as well.  These are irritating because of location.  She has had skin tags removed by shave excision previously.  Past Medical History:  Diagnosis Date  . Allergy   . Asthma   . Chicken pox   . Frequent headaches   . Hypertension   . Urinary tract infection    Past Surgical History:  Procedure Laterality Date  . CHOLECYSTECTOMY      reports that she has never smoked. She has never used smokeless tobacco. She reports that she does not drink alcohol or use drugs. family history includes Arthritis in her mother; Hypertension in her father and mother; Parkinson's disease in her father. Allergies  Allergen Reactions  . Tylenol With Codeine #3 [Acetaminophen-Codeine] Nausea And Vomiting  . Codeine Other (See Comments)    Tylenol with Codeine (Vomiting)     Review of Systems  Constitutional: Negative for appetite change, chills, fever and unexpected weight change.  Respiratory: Negative for cough.        Objective:   Physical Exam Vitals reviewed.  Constitutional:      Appearance: Normal appearance.  Cardiovascular:     Rate and Rhythm: Normal rate and regular rhythm.  Skin:    Comments: Patient seen with several skin tags including posterior right thigh, posterior left thigh, and right axillary region.  Letter her left and right thigh are particularly larger and have somewhat wider base.  She has several smaller skin tags right axillary region.  Neurological:     Mental Status: She is alert.        Assessment:     Multiple skin tags.  These  all appear benign.  They are irritating because of location    Plan:     -We discussed risk and benefits of treating the larger skin tags with shave excision including risk of bleeding, scarring, infection and patient consented. We used Xylocaine with epi for local anesthesia of 4 larger skin tags including 2 in the axillary region 1 left posterior thigh and 1 right posterior thigh.  Skin clean with alcohol.  Using #15 blade removed by shave excision.  She had minimal bleeding controlled with silver nitrate swab.  Topical antibiotic and dressing applied  -She had 9 smaller skin tags right axillary region.  We discussed treatment of these with liquid nitrogen and discussed risk including pain and potential blistering and low risk of secondary infection following treatment.  She consented.  These were all treated without difficulty and patient tolerated well  Keep all skin lesions dry for 24 hours then clean daily with soap and water and follow-up for signs of secondary infection  Eulas Post MD [facility] at [facility]

## 2019-11-01 ENCOUNTER — Other Ambulatory Visit: Payer: Self-pay | Admitting: Family Medicine

## 2020-06-18 DIAGNOSIS — Z1231 Encounter for screening mammogram for malignant neoplasm of breast: Secondary | ICD-10-CM | POA: Diagnosis not present

## 2020-06-23 DIAGNOSIS — H1045 Other chronic allergic conjunctivitis: Secondary | ICD-10-CM | POA: Diagnosis not present

## 2020-06-23 DIAGNOSIS — J3089 Other allergic rhinitis: Secondary | ICD-10-CM | POA: Diagnosis not present

## 2020-06-23 DIAGNOSIS — J3081 Allergic rhinitis due to animal (cat) (dog) hair and dander: Secondary | ICD-10-CM | POA: Diagnosis not present

## 2020-06-23 DIAGNOSIS — J4520 Mild intermittent asthma, uncomplicated: Secondary | ICD-10-CM | POA: Diagnosis not present

## 2020-10-22 ENCOUNTER — Other Ambulatory Visit: Payer: Self-pay | Admitting: Family Medicine

## 2021-03-14 ENCOUNTER — Other Ambulatory Visit: Payer: Self-pay | Admitting: Family Medicine

## 2021-04-14 ENCOUNTER — Other Ambulatory Visit: Payer: Self-pay | Admitting: Family Medicine

## 2021-04-20 ENCOUNTER — Encounter: Payer: Self-pay | Admitting: Family Medicine

## 2021-04-20 ENCOUNTER — Other Ambulatory Visit: Payer: Self-pay

## 2021-04-20 ENCOUNTER — Ambulatory Visit (INDEPENDENT_AMBULATORY_CARE_PROVIDER_SITE_OTHER): Payer: BC Managed Care – PPO | Admitting: Family Medicine

## 2021-04-20 VITALS — BP 124/80 | HR 70 | Temp 98.4°F | Ht 65.0 in | Wt 197.5 lb

## 2021-04-20 DIAGNOSIS — Z0000 Encounter for general adult medical examination without abnormal findings: Secondary | ICD-10-CM | POA: Diagnosis not present

## 2021-04-20 DIAGNOSIS — I10 Essential (primary) hypertension: Secondary | ICD-10-CM | POA: Diagnosis not present

## 2021-04-20 LAB — CBC WITH DIFFERENTIAL/PLATELET
Basophils Absolute: 0 10*3/uL (ref 0.0–0.1)
Basophils Relative: 0.8 % (ref 0.0–3.0)
Eosinophils Absolute: 0.2 10*3/uL (ref 0.0–0.7)
Eosinophils Relative: 4.4 % (ref 0.0–5.0)
HCT: 45.6 % (ref 36.0–46.0)
Hemoglobin: 15.1 g/dL — ABNORMAL HIGH (ref 12.0–15.0)
Lymphocytes Relative: 38.5 % (ref 12.0–46.0)
Lymphs Abs: 2.1 10*3/uL (ref 0.7–4.0)
MCHC: 33.2 g/dL (ref 30.0–36.0)
MCV: 89.2 fl (ref 78.0–100.0)
Monocytes Absolute: 0.4 10*3/uL (ref 0.1–1.0)
Monocytes Relative: 6.7 % (ref 3.0–12.0)
Neutro Abs: 2.7 10*3/uL (ref 1.4–7.7)
Neutrophils Relative %: 49.6 % (ref 43.0–77.0)
Platelets: 257 10*3/uL (ref 150.0–400.0)
RBC: 5.12 Mil/uL — ABNORMAL HIGH (ref 3.87–5.11)
RDW: 13.9 % (ref 11.5–15.5)
WBC: 5.3 10*3/uL (ref 4.0–10.5)

## 2021-04-20 LAB — HEPATIC FUNCTION PANEL
ALT: 17 U/L (ref 0–35)
AST: 17 U/L (ref 0–37)
Albumin: 4.8 g/dL (ref 3.5–5.2)
Alkaline Phosphatase: 75 U/L (ref 39–117)
Bilirubin, Direct: 0.2 mg/dL (ref 0.0–0.3)
Total Bilirubin: 0.8 mg/dL (ref 0.2–1.2)
Total Protein: 7.2 g/dL (ref 6.0–8.3)

## 2021-04-20 LAB — LIPID PANEL
Cholesterol: 193 mg/dL (ref 0–200)
HDL: 67.1 mg/dL (ref >39.00)
LDL Cholesterol: 102 mg/dL — ABNORMAL HIGH (ref 0–99)
NonHDL: 126.36
Total CHOL/HDL Ratio: 3
Triglycerides: 123 mg/dL (ref 0.0–149.0)
VLDL: 24.6 mg/dL (ref 0.0–40.0)

## 2021-04-20 LAB — BASIC METABOLIC PANEL
BUN: 14 mg/dL (ref 6–23)
CO2: 28 mEq/L (ref 19–32)
Calcium: 9.8 mg/dL (ref 8.4–10.5)
Chloride: 105 mEq/L (ref 96–112)
Creatinine, Ser: 0.74 mg/dL (ref 0.40–1.20)
GFR: 88.78 mL/min (ref >60.00)
Glucose, Bld: 90 mg/dL (ref 70–99)
Potassium: 4.1 mEq/L (ref 3.5–5.1)
Sodium: 142 mEq/L (ref 135–145)

## 2021-04-20 LAB — TSH: TSH: 3.06 u[IU]/mL (ref 0.35–5.50)

## 2021-04-20 NOTE — Progress Notes (Signed)
Established Patient Office Visit  Subjective:  Patient ID: Jordan Gallegos, female    DOB: 10-31-1960  Age: 60 y.o. MRN: [id number]  CC:  Chief Complaint  Patient presents with   Annual Exam    HPI Jordan Gallegos presents for annual physical exam.  She sees gynecologist for Pap smears.  She has history of hypertension and takes lisinopril for that.  Blood pressure well controlled.  She has gained a little weight this past year but hopes to start back more consistent exercise and stricter diet soon.  Health maintenance reviewed:  -Prior hepatitis C negative -She is scheduled for mammogram in December -Tetanus due but she declines at this time -She did Cologuard 4 years ago.  She declines at this time but will consider during the next year -Declines influenza and Pneumovax vaccines  Social history-married for 9 years.  She has 2 stepchildren.  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 Non-smoker.  No regular alcohol use.  Family history-mother alive and has history of hypertension and asthma.  Her father died complications of Parkinson's disease.  He also had hypertension.  She had a sister that died complications of gastroparesis.  Sister was not a diabetic.  Past Medical History:  Diagnosis Date   Allergy    Asthma    Chicken pox    Frequent headaches    Hypertension    Urinary tract infection     Past Surgical History:  Procedure Laterality Date   CHOLECYSTECTOMY      Family History  Problem Relation Age of Onset   Arthritis Mother    Hypertension Mother    Hypertension Father    Parkinson's disease Father    Colon cancer Neg Hx    Stomach cancer Neg Hx     Social History   Socioeconomic History   Marital status: Married    Spouse name: Not on file   Number of children: 0   Years of education: Not on file   Highest education level: Not on file  Occupational History    Employer: [employer]  Tobacco Use   Smoking status:  Never   Smokeless tobacco: Never  Vaping Use   Vaping Use: Never used  Substance and Sexual Activity   Alcohol use: No   Drug use: No   Sexual activity: Yes    Birth control/protection: Pill  Other Topics Concern   Not on file  Social History Narrative   Married, no children    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     Flaxseed, Linseed, OIL Take 1,400 mg by mouth daily.     levocetirizine (XYZAL) 5 MG tablet      lisinopril (ZESTRIL) 10 MG tablet TAKE 1 TABLET BY MOUTH EVERY DAY 90 tablet 0   Magnesium 400 MG CAPS Take 400 mg by mouth daily.     montelukast (SINGULAIR)  10 MG tablet Take 10 mg by mouth at bedtime.      Multiple Vitamins-Calcium (ONE-A-DAY WITHIN PO) Take by mouth daily.     vitamin E 180 MG (400 UNITS) capsule Take 400 Units by mouth daily.     No facility-administered medications prior to visit.    Allergies  Allergen Reactions   Tylenol With Codeine #3 [Acetaminophen-Codeine] Nausea And Vomiting   Codeine Other (See Comments)    Tylenol with Codeine (Vomiting)    ROS Review of Systems  Constitutional:  Negative for activity change, appetite change, fatigue, fever and unexpected weight change.  HENT:  Negative for ear pain, hearing loss, sore throat and trouble swallowing.   Eyes:  Negative for visual disturbance.  Respiratory:  Negative for cough, chest tightness, shortness of breath and wheezing.   Cardiovascular:  Negative for  chest pain, palpitations and leg swelling.  Gastrointestinal:  Negative for abdominal pain, blood in stool, constipation and diarrhea.  Endocrine: Negative for polydipsia and polyuria.  Genitourinary:  Negative for dysuria and hematuria.  Musculoskeletal:  Negative for arthralgias, back pain and myalgias.  Skin:  Negative for rash.  Neurological:  Negative for dizziness, seizures, syncope, weakness, light-headedness and headaches.  Hematological:  Negative for adenopathy.  Psychiatric/Behavioral:  Negative for confusion and dysphoric mood.      Objective:    Physical Exam Constitutional:      Appearance: She is well-developed.  HENT:     Head: Normocephalic and atraumatic.  Eyes:     Pupils: Pupils are equal, round, and reactive to light.  Neck:     Thyroid: No thyromegaly.  Cardiovascular:     Rate and Rhythm: Normal rate and regular rhythm.     Heart sounds: Normal heart sounds. No murmur heard. Pulmonary:     Effort: No respiratory distress.     Breath sounds: Normal breath sounds. No wheezing or rales.  Abdominal:     General: Bowel sounds are normal. There is no distension.     Palpations: Abdomen is soft. There is no mass.     Tenderness: There is no abdominal tenderness. There is no guarding or rebound.  Genitourinary:    Comments: Per gyn  Musculoskeletal:        General: Normal range of motion.     Cervical back: Normal range of motion and neck supple.  Lymphadenopathy:     Cervical: No cervical adenopathy.  Skin:    Findings: No rash.  Neurological:     Mental Status: She is alert and oriented to person, place, and time.     Cranial Nerves: No cranial nerve deficit.     Deep Tendon Reflexes: Reflexes normal.  Psychiatric:        Behavior: Behavior normal.        Thought Content: Thought content normal.        Judgment: Judgment normal.    BP 124/80 (BP Location: Left Arm, Patient Position: Sitting, Cuff Size: Normal)   Pulse 70   Temp 98.4 F (36.9 C)  (Oral)   Ht 5\' 5"  (1.651 m)   Wt 197 lb 8 oz (89.6 kg)   SpO2 99%   BMI 32.87 kg/m  Wt Readings from Last 3 Encounters:  04/20/21 197 lb 8 oz (89.6 kg)  07/09/19 192 lb 4.8 oz (87.2 kg)  06/25/19 190 lb 12.8 oz (86.5 kg)     Health Maintenance Due  Topic Date Due   HIV Screening  Never done   COVID-19 Vaccine (2 -  Moderna risk series) 07/21/2020    There are no preventive care reminders to display for this patient.  Lab Results  Component Value Date   TSH 2.93 06/25/2019   Lab Results  Component Value Date   WBC 5.3 06/25/2019   HGB 14.7 06/25/2019   HCT 44.3 06/25/2019   MCV 90.4 06/25/2019   PLT 264.0 06/25/2019   Lab Results  Component Value Date   NA 140 06/25/2019   K 4.5 06/25/2019   CO2 27 06/25/2019   GLUCOSE 90 06/25/2019   BUN 13 06/25/2019   CREATININE 0.70 06/25/2019   BILITOT 0.5 06/25/2019   ALKPHOS 77 06/25/2019   AST 19 06/25/2019   ALT 18 06/25/2019   PROT 7.0 06/25/2019   ALBUMIN 4.9 06/25/2019   CALCIUM 10.1 06/25/2019   ANIONGAP 16 (H) 12/20/2013   GFR 86.17 06/25/2019   Lab Results  Component Value Date   CHOL 225 (H) 06/25/2019   Lab Results  Component Value Date   HDL 64.60 06/25/2019   Lab Results  Component Value Date   LDLCALC 130 (H) 06/25/2019   Lab Results  Component Value Date   TRIG 152.0 (H) 06/25/2019   Lab Results  Component Value Date   CHOLHDL 3 06/25/2019   No results found for: HGBA1C    Assessment & Plan:   Problem List Items Addressed This Visit   None Visit Diagnoses     Physical exam    -  Primary   Relevant Orders   Basic metabolic panel   Lipid panel   CBC with Differential/Platelet   TSH   Hepatic function panel     We recommended flu vaccine and consider Pneumovax with her asthma history but she declines  She declines Shingrix vaccine  We discussed repeating Cologuard versus colonoscopy.  She declines both at this time but will consider Cologuard during the next year  Screening  labs as above    No orders of the defined types were placed in this encounter.   Follow-up: No follow-ups on file.    Carolann Littler, MD

## 2021-04-20 NOTE — Patient Instructions (Signed)
Consider Cologuard at some point in the next year  Let us know if you change your mind regarding flu, pneumonia, or shingles vaccine.

## 2021-06-19 DIAGNOSIS — Z1231 Encounter for screening mammogram for malignant neoplasm of breast: Secondary | ICD-10-CM | POA: Diagnosis not present

## 2021-06-19 LAB — HM MAMMOGRAPHY

## 2021-06-23 DIAGNOSIS — J3081 Allergic rhinitis due to animal (cat) (dog) hair and dander: Secondary | ICD-10-CM | POA: Diagnosis not present

## 2021-06-23 DIAGNOSIS — J3089 Other allergic rhinitis: Secondary | ICD-10-CM | POA: Diagnosis not present

## 2021-06-23 DIAGNOSIS — J4520 Mild intermittent asthma, uncomplicated: Secondary | ICD-10-CM | POA: Diagnosis not present

## 2021-06-23 DIAGNOSIS — H1045 Other chronic allergic conjunctivitis: Secondary | ICD-10-CM | POA: Diagnosis not present

## 2021-06-24 ENCOUNTER — Encounter: Payer: Self-pay | Admitting: Family Medicine

## 2021-07-10 ENCOUNTER — Other Ambulatory Visit: Payer: Self-pay | Admitting: Family Medicine

## 2021-08-21 ENCOUNTER — Encounter: Payer: Self-pay | Admitting: Family Medicine

## 2021-08-30 DIAGNOSIS — Z1211 Encounter for screening for malignant neoplasm of colon: Secondary | ICD-10-CM | POA: Diagnosis not present

## 2021-09-06 LAB — COLOGUARD: COLOGUARD: NEGATIVE

## 2021-09-07 ENCOUNTER — Telehealth: Payer: Self-pay | Admitting: Family Medicine

## 2021-09-07 NOTE — Telephone Encounter (Signed)
Patient called in to return Mykal's phone call regarding results. ? ?Please advise. ?

## 2021-09-07 NOTE — Telephone Encounter (Signed)
Please see result note 

## 2021-10-03 ENCOUNTER — Encounter: Payer: Self-pay | Admitting: Family Medicine

## 2021-10-05 ENCOUNTER — Other Ambulatory Visit: Payer: Self-pay | Admitting: Family Medicine

## 2021-11-13 ENCOUNTER — Encounter: Payer: Self-pay | Admitting: Family Medicine

## 2022-02-15 DIAGNOSIS — Z01411 Encounter for gynecological examination (general) (routine) with abnormal findings: Secondary | ICD-10-CM | POA: Diagnosis not present

## 2022-02-15 DIAGNOSIS — Z124 Encounter for screening for malignant neoplasm of cervix: Secondary | ICD-10-CM | POA: Diagnosis not present

## 2022-02-15 DIAGNOSIS — Z6834 Body mass index (BMI) 34.0-34.9, adult: Secondary | ICD-10-CM | POA: Diagnosis not present

## 2022-02-15 DIAGNOSIS — Z0142 Encounter for cervical smear to confirm findings of recent normal smear following initial abnormal smear: Secondary | ICD-10-CM | POA: Diagnosis not present

## 2022-02-15 DIAGNOSIS — Z01419 Encounter for gynecological examination (general) (routine) without abnormal findings: Secondary | ICD-10-CM | POA: Diagnosis not present

## 2022-03-29 ENCOUNTER — Other Ambulatory Visit: Payer: Self-pay | Admitting: Family Medicine

## 2022-04-22 ENCOUNTER — Other Ambulatory Visit: Payer: Self-pay | Admitting: Family Medicine

## 2022-05-11 ENCOUNTER — Encounter: Payer: Self-pay | Admitting: Family Medicine

## 2022-05-11 ENCOUNTER — Ambulatory Visit (INDEPENDENT_AMBULATORY_CARE_PROVIDER_SITE_OTHER): Payer: BC Managed Care – PPO | Admitting: Family Medicine

## 2022-05-11 VITALS — BP 122/74 | HR 60 | Temp 97.7°F | Ht 65.35 in | Wt 208.1 lb

## 2022-05-11 DIAGNOSIS — Z0000 Encounter for general adult medical examination without abnormal findings: Secondary | ICD-10-CM | POA: Diagnosis not present

## 2022-05-11 LAB — LIPID PANEL
Cholesterol: 241 mg/dL — ABNORMAL HIGH (ref 0–200)
HDL: 81.8 mg/dL (ref >39.00)
LDL Cholesterol: 131 mg/dL — ABNORMAL HIGH (ref 0–99)
NonHDL: 158.98
Total CHOL/HDL Ratio: 3
Triglycerides: 139 mg/dL (ref 0.0–149.0)
VLDL: 27.8 mg/dL (ref 0.0–40.0)

## 2022-05-11 LAB — BASIC METABOLIC PANEL
BUN: 12 mg/dL (ref 6–23)
CO2: 29 mEq/L (ref 19–32)
Calcium: 10.4 mg/dL (ref 8.4–10.5)
Chloride: 102 mEq/L (ref 96–112)
Creatinine, Ser: 0.74 mg/dL (ref 0.40–1.20)
GFR: 88.12 mL/min (ref >60.00)
Glucose, Bld: 82 mg/dL (ref 70–99)
Potassium: 3.7 mEq/L (ref 3.5–5.1)
Sodium: 141 mEq/L (ref 135–145)

## 2022-05-11 LAB — HEPATIC FUNCTION PANEL
ALT: 24 U/L (ref 0–35)
AST: 24 U/L (ref 0–37)
Albumin: 5.2 g/dL (ref 3.5–5.2)
Alkaline Phosphatase: 82 U/L (ref 39–117)
Bilirubin, Direct: 0.2 mg/dL (ref 0.0–0.3)
Total Bilirubin: 0.9 mg/dL (ref 0.2–1.2)
Total Protein: 8 g/dL (ref 6.0–8.3)

## 2022-05-11 LAB — CBC WITH DIFFERENTIAL/PLATELET
Basophils Absolute: 0.1 10*3/uL (ref 0.0–0.1)
Basophils Relative: 1 % (ref 0.0–3.0)
Eosinophils Absolute: 0.2 10*3/uL (ref 0.0–0.7)
Eosinophils Relative: 3.4 % (ref 0.0–5.0)
HCT: 49.3 % — ABNORMAL HIGH (ref 36.0–46.0)
Hemoglobin: 16.3 g/dL — ABNORMAL HIGH (ref 12.0–15.0)
Lymphocytes Relative: 37.9 % (ref 12.0–46.0)
Lymphs Abs: 2.4 10*3/uL (ref 0.7–4.0)
MCHC: 33.2 g/dL (ref 30.0–36.0)
MCV: 89.6 fl (ref 78.0–100.0)
Monocytes Absolute: 0.5 10*3/uL (ref 0.1–1.0)
Monocytes Relative: 7.1 % (ref 3.0–12.0)
Neutro Abs: 3.3 10*3/uL (ref 1.4–7.7)
Neutrophils Relative %: 50.6 % (ref 43.0–77.0)
Platelets: 275 10*3/uL (ref 150.0–400.0)
RBC: 5.5 Mil/uL — ABNORMAL HIGH (ref 3.87–5.11)
RDW: 13.9 % (ref 11.5–15.5)
WBC: 6.5 10*3/uL (ref 4.0–10.5)

## 2022-05-11 LAB — TSH: TSH: 2.44 u[IU]/mL (ref 0.35–5.50)

## 2022-05-11 MED ORDER — LISINOPRIL 10 MG PO TABS
10.0000 mg | ORAL_TABLET | Freq: Every day | ORAL | 3 refills | Status: DC
Start: 2022-05-11 — End: 2023-05-06

## 2022-05-11 NOTE — Progress Notes (Signed)
Established Patient Office Visit  Subjective   Patient ID: Jordan Gallegos, female    DOB: 09-10-1960  Age: 61 y.o. MRN: [id number]  Chief Complaint  Patient presents with   Annual Exam    HPI   Jordan Gallegos is seen for well visit.  She has hypertension treated with lisinopril 10 mg daily.  Blood pressure well controlled.  She had a daughter that got married earlier this year.  Patient is generally doing well.  Health maintenance reviewed  -She sees GYN and just had Pap smear couple months ago -Cologuard was completed 3/23 -She is planning on getting repeat mammogram this January -Prior hepatitis C screening negative -Declines flu vaccine -Declines Shingrix.  She has had shingles clinically in the past.  Social history-married for 9 years.  She has 2 stepchildren.  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 Non-smoker.  No regular alcohol use.   Family history-mother alive and has history of hypertension and asthma.  Her father died complications of Parkinson's disease.  He also had hypertension.  She had a sister that died complications of gastroparesis.  Sister was not a diabetic  Past Medical History:  Diagnosis Date   Allergy    Asthma    Chicken pox    Frequent headaches    Hypertension    Urinary tract infection    Past Surgical History:  Procedure Laterality Date   CHOLECYSTECTOMY      reports that she has never smoked. She has never used smokeless tobacco. She reports that she does not drink alcohol and does not use drugs. family history includes Arthritis in her mother; Hypertension in her father and mother; Parkinson's disease in her father. Allergies  Allergen Reactions   Tylenol With Codeine #3 [Acetaminophen-Codeine] Nausea And Vomiting   Codeine Other (See Comments)    Tylenol with Codeine (Vomiting)     Review of Systems  Constitutional:  Negative for chills, fever, malaise/fatigue and weight loss.  HENT:  Negative for hearing loss.    Eyes:  Negative for blurred vision and double vision.  Respiratory:  Negative for cough and shortness of breath.   Cardiovascular:  Negative for chest pain, palpitations and leg swelling.  Gastrointestinal:  Negative for abdominal pain, blood in stool, constipation and diarrhea.  Genitourinary:  Negative for dysuria.  Skin:  Negative for rash.  Neurological:  Negative for dizziness, speech change, seizures, loss of consciousness and headaches.  Psychiatric/Behavioral:  Negative for depression.       Objective:     BP 122/74 (BP Location: Left Arm, Patient Position: Sitting, Cuff Size: Normal)   Pulse 60   Temp 97.7 F (36.5 C) (Oral)   Ht 5' 5.35" (1.66 m)   Wt 208 lb 1.6 oz (94.4 kg)   SpO2 98%   BMI 34.26 kg/m  BP Readings from Last 3 Encounters:  05/11/22 122/74  04/20/21 124/80  07/09/19 112/72   Wt Readings from Last 3 Encounters:  05/11/22 208 lb 1.6 oz (94.4 kg)  04/20/21 197 lb 8 oz (89.6 kg)  07/09/19 192 lb 4.8 oz (87.2 kg)      Physical Exam Vitals reviewed.  Constitutional:      Appearance: She is well-developed.  HENT:     Head: Normocephalic and atraumatic.  Eyes:     Pupils: Pupils are equal, round, and reactive to light.  Neck:     Thyroid: No thyromegaly.  Cardiovascular:     Rate and Rhythm: Normal rate and regular rhythm.  Heart sounds: Normal heart sounds. No murmur heard. Pulmonary:     Effort: No respiratory distress.     Breath sounds: Normal breath sounds. No wheezing or rales.  Abdominal:     General: Bowel sounds are normal. There is no distension.     Palpations: Abdomen is soft. There is no mass.     Tenderness: There is no abdominal tenderness. There is no guarding.  Musculoskeletal:        General: Normal range of motion.     Cervical back: Normal range of motion and neck supple.     Right lower leg: No edema.     Left lower leg: No edema.  Lymphadenopathy:     Cervical: No cervical adenopathy.  Skin:    Findings: No  rash.  Neurological:     Mental Status: She is alert and oriented to person, place, and time.     Cranial Nerves: No cranial nerve deficit.  Psychiatric:        Behavior: Behavior normal.        Thought Content: Thought content normal.        Judgment: Judgment normal.      No results found for any visits on 05/11/22.    The 10-year ASCVD risk score (Arnett DK, et al., 2019) is: 3.4%    Assessment & Plan:   Problem List Items Addressed This Visit   None Visit Diagnoses     Physical exam    -  Primary   Relevant Orders   Basic metabolic panel   Lipid panel   CBC with Differential/Platelet   TSH   Hepatic function panel     -Offered flu vaccine and she declines -Discussed Shingrix vaccine and she will consider -She had recent Pap smear through GYN and plans to get mammogram in January -Consider repeat Cologuard in 2 years -Work on weight loss -Refilled lisinopril for 1 year  No follow-ups on file.    Carolann Littler, MD

## 2022-05-11 NOTE — Patient Instructions (Signed)
Consider Shingrix at some point.

## 2022-05-18 ENCOUNTER — Encounter: Payer: Self-pay | Admitting: Family Medicine

## 2022-06-25 DIAGNOSIS — Z1231 Encounter for screening mammogram for malignant neoplasm of breast: Secondary | ICD-10-CM | POA: Diagnosis not present

## 2022-06-25 DIAGNOSIS — J3081 Allergic rhinitis due to animal (cat) (dog) hair and dander: Secondary | ICD-10-CM | POA: Diagnosis not present

## 2022-06-25 DIAGNOSIS — H1045 Other chronic allergic conjunctivitis: Secondary | ICD-10-CM | POA: Diagnosis not present

## 2022-06-25 DIAGNOSIS — J4520 Mild intermittent asthma, uncomplicated: Secondary | ICD-10-CM | POA: Diagnosis not present

## 2022-06-25 DIAGNOSIS — J3089 Other allergic rhinitis: Secondary | ICD-10-CM | POA: Diagnosis not present

## 2022-06-25 LAB — HM MAMMOGRAPHY

## 2022-06-30 ENCOUNTER — Encounter: Payer: Self-pay | Admitting: Family Medicine

## 2023-03-09 ENCOUNTER — Ambulatory Visit (INDEPENDENT_AMBULATORY_CARE_PROVIDER_SITE_OTHER): Payer: PRIVATE HEALTH INSURANCE

## 2023-03-09 ENCOUNTER — Telehealth (INDEPENDENT_AMBULATORY_CARE_PROVIDER_SITE_OTHER): Payer: PRIVATE HEALTH INSURANCE | Admitting: Family Medicine

## 2023-03-09 DIAGNOSIS — R6889 Other general symptoms and signs: Secondary | ICD-10-CM | POA: Diagnosis not present

## 2023-03-09 DIAGNOSIS — U071 COVID-19: Secondary | ICD-10-CM

## 2023-03-09 LAB — POC COVID19 BINAXNOW: SARS Coronavirus 2 Ag: POSITIVE — AB

## 2023-03-09 LAB — POCT INFLUENZA A/B
Influenza A, POC: NEGATIVE
Influenza B, POC: NEGATIVE

## 2023-03-09 LAB — POCT RAPID STREP A (OFFICE): Rapid Strep A Screen: NEGATIVE

## 2023-03-09 NOTE — Progress Notes (Signed)
Patient was unable to self-report due to a lack of equipment at home via telehealth

## 2023-03-09 NOTE — Progress Notes (Signed)
Patient ID: Jordan Gallegos, female   DOB: [birth year], 62 y.o.   MRN: [id number]   Virtual Visit via Video Note  I connected with Jordan Gallegos on 03/09/23 at  3:00 PM EDT by a video enabled telemedicine application and verified that I am speaking with the correct person using two identifiers.  Location patient: home Location provider:work or home office Persons participating in the virtual visit: patient, provider  I discussed the limitations of evaluation and management by telemedicine and the availability of in person appointments. The patient expressed understanding and agreed to proceed.   HPI: Jordan Gallegos has COVID.  She tested positive here today.  Onset of symptoms Sunday.  She has had predominantly sore throat but also some congestion and sneezing.  Minimal cough.  She had some fever on Sunday and Monday but none since then.  She has tried multiple over-the-counter medications such as Cepacol and DayQuil without much relief.  Took some Tylenol initially first couple days but none since then.  Denies any nausea, vomiting, or diarrhea.  No chronic lung disease.  She is feeling some better overall compared with Sunday and Monday.   ROS: See pertinent positives and negatives per HPI.  Past Medical History:  Diagnosis Date   Allergy    Asthma    Chicken pox    Frequent headaches    Hypertension    Urinary tract infection     Past Surgical History:  Procedure Laterality Date   CHOLECYSTECTOMY      Family History  Problem Relation Age of Onset   Arthritis Mother    Hypertension Mother    Hypertension Father    Parkinson's disease Father    Colon cancer Neg Hx    Stomach cancer Neg Hx     SOCIAL HX:   Non-smoker   Current Outpatient Medications:    albuterol (PROVENTIL HFA;VENTOLIN HFA) 108 (90 BASE) MCG/ACT inhaler, Inhale 2 puffs into the lungs as needed for wheezing., Disp: , Rfl:    levocetirizine (XYZAL) 5 MG tablet, , Disp: , Rfl:    lisinopril (ZESTRIL) 10 MG  tablet, Take 1 tablet (10 mg total) by mouth daily., Disp: 90 tablet, Rfl: 3   Magnesium 400 MG CAPS, Take 400 mg by mouth daily., Disp: , Rfl:    montelukast (SINGULAIR) 10 MG tablet, Take 10 mg by mouth at bedtime. , Disp: , Rfl:    Multiple Vitamins-Calcium (ONE-A-DAY WITHIN PO), Take by mouth daily., Disp: , Rfl:    vitamin E 180 MG (400 UNITS) capsule, Take 400 Units by mouth daily., Disp: , Rfl:   EXAM:  VITALS per patient if applicable:  GENERAL: alert, oriented, appears well and in no acute distress  HEENT: atraumatic, conjunttiva clear, no obvious abnormalities on inspection of external nose and ears  NECK: normal movements of the head and neck  LUNGS: on inspection no signs of respiratory distress, breathing rate appears normal, no obvious gross SOB, gasping or wheezing  CV: no obvious cyanosis  MS: moves all visible extremities without noticeable abnormality  PSYCH/NEURO: pleasant and cooperative, no obvious depression or anxiety, speech and thought processing grossly intact  ASSESSMENT AND PLAN:  Discussed the following assessment and plan:   COVID infection.  Patient doing reasonably well except for fairly severe sore throat.  Rapid strep today negative and also negative for influenza.  Positive for COVID.  We discussed antiviral therapies but she declines.  -Recommend she try Tylenol and or Advil for sore throat symptoms. -Follow-up for any increased  shortness of breath or other concerns -Discussed isolation recommendations    I discussed the assessment and treatment plan with the patient. The patient was provided an opportunity to ask questions and all were answered. The patient agreed with the plan and demonstrated an understanding of the instructions.   The patient was advised to call back or seek an in-person evaluation if the symptoms worsen or if the condition fails to improve as anticipated.     Evelena Peat, MD

## 2023-05-06 ENCOUNTER — Other Ambulatory Visit: Payer: Self-pay | Admitting: Family Medicine

## 2023-07-08 LAB — HM MAMMOGRAPHY

## 2023-07-11 ENCOUNTER — Ambulatory Visit (INDEPENDENT_AMBULATORY_CARE_PROVIDER_SITE_OTHER): Payer: PRIVATE HEALTH INSURANCE | Admitting: Family Medicine

## 2023-07-11 ENCOUNTER — Encounter: Payer: Self-pay | Admitting: Family Medicine

## 2023-07-11 VITALS — BP 158/84 | HR 60 | Temp 97.8°F | Wt 206.9 lb

## 2023-07-11 DIAGNOSIS — Z0000 Encounter for general adult medical examination without abnormal findings: Secondary | ICD-10-CM | POA: Diagnosis not present

## 2023-07-11 NOTE — Progress Notes (Signed)
Established Patient Office Visit  Subjective   Patient ID: Jordan Gallegos, female    DOB: Dec 20, 1960  Age: 63 y.o. MRN: [id number]  Chief Complaint  Patient presents with   Annual Exam    HPI    Jordan Gallegos is seen today for physical exam/well care.  She does see gynecologist yearly and has upcoming appointment soon.  She is followed at [facility].  She is getting mammograms through [facility] and states that was done just last week.  She gets Pap smears through GYN.  She does have hypertension treated with lisinopril 10 mg daily.  Compliant with therapy.  Not monitoring blood pressures at home.  Health maintenance reviewed:  Health Maintenance  Topic Date Due   HIV Screening  Never done   Cervical Cancer Screening (HPV/Pap Cotest)  Never done   COVID-19 Vaccine (2 - 2024-25 season) 02/20/2023   INFLUENZA VACCINE  09/19/2023 (Originally 01/20/2023)   Zoster Vaccines- Shingrix (1 of 2) 10/09/2023 (Originally 03/26/2012)   DTaP/Tdap/Td (2 - Tdap) 07/10/2024 (Originally 06/21/2020)   Pneumococcal Vaccine 68-6 Years old (1 of 2 - PCV) 07/10/2024 (Originally 03/26/1968)   MAMMOGRAM  06/25/2024   Fecal DNA (Cologuard)  08/30/2024   Hepatitis C Screening  Completed   HPV VACCINES  Aged Out   -She is doing Cologuard and would be due for repeat 2026 -She declined several vaccines including influenza, pneumococcal, Shingrix  Social history-married for 10 years.  She has 2 stepchildren.  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  No regular alcohol use.   Family history-mother alive and has history of hypertension and asthma.  Her father died complications of Parkinson's disease.  He also had hypertension.  She had a sister that died complications of gastroparesis. Sister was not a diabetic  Past Medical History:  Diagnosis Date   Allergy    Asthma    Chicken pox    Frequent headaches    Hypertension    Urinary tract infection    Past Surgical History:   Procedure Laterality Date   CHOLECYSTECTOMY      reports that she has never smoked. She has never used smokeless tobacco. She reports that she does not drink alcohol and does not use drugs. family history includes Arthritis in her mother; Hypertension in her father and mother; Parkinson's disease in her father. Allergies  Allergen Reactions   Tylenol With Codeine #3 [Acetaminophen-Codeine] Nausea And Vomiting   Codeine Other (See Comments)    Tylenol with Codeine (Vomiting)     Review of Systems  Constitutional:  Negative for chills, fever, malaise/fatigue and weight loss.  HENT:  Negative for hearing loss.   Eyes:  Negative for blurred vision and double vision.  Respiratory:  Negative for cough and shortness of breath.   Cardiovascular:  Negative for chest pain, palpitations and leg swelling.  Gastrointestinal:  Negative for abdominal pain, blood in stool, constipation and diarrhea.  Genitourinary:  Negative for dysuria.  Skin:  Negative for rash.  Neurological:  Negative for dizziness, speech change, seizures, loss of consciousness and headaches.  Psychiatric/Behavioral:  Negative for depression.       Objective:     BP (!) 158/84   Pulse 60   Temp 97.8 F (36.6 C) (Oral)   Wt 206 lb 14.4 oz (93.8 kg)   SpO2 99%   BMI 34.06 kg/m  BP Readings from Last 3 Encounters:  07/11/23 (!) 158/84  05/11/22 122/74  04/20/21 124/80   Wt Readings from Last 3  Encounters:  07/11/23 206 lb 14.4 oz (93.8 kg)  05/11/22 208 lb 1.6 oz (94.4 kg)  04/20/21 197 lb 8 oz (89.6 kg)      Physical Exam Vitals reviewed.  Constitutional:      General: She is not in acute distress.    Appearance: She is well-developed. She is not ill-appearing.  HENT:     Head: Normocephalic and atraumatic.     Right Ear: Tympanic membrane normal.     Left Ear: Tympanic membrane normal.  Eyes:     Pupils: Pupils are equal, round, and reactive to light.  Neck:     Thyroid: No thyromegaly.   Cardiovascular:     Rate and Rhythm: Normal rate and regular rhythm.     Heart sounds: Normal heart sounds. No murmur heard. Pulmonary:     Effort: No respiratory distress.     Breath sounds: Normal breath sounds. No wheezing or rales.  Abdominal:     General: Bowel sounds are normal. There is no distension.     Palpations: Abdomen is soft. There is no mass.     Tenderness: There is no abdominal tenderness. There is no guarding or rebound.  Musculoskeletal:        General: Normal range of motion.     Cervical back: Normal range of motion and neck supple.  Lymphadenopathy:     Cervical: No cervical adenopathy.  Skin:    Findings: No rash.  Neurological:     Mental Status: She is alert and oriented to person, place, and time.     Cranial Nerves: No cranial nerve deficit.  Psychiatric:        Behavior: Behavior normal.        Thought Content: Thought content normal.        Judgment: Judgment normal.      No results found for any visits on 07/11/23.  Last CBC Lab Results  Component Value Date   WBC 6.5 05/11/2022   HGB 16.3 (H) 05/11/2022   HCT 49.3 (H) 05/11/2022   MCV 89.6 05/11/2022   MCH 30.7 12/20/2013   RDW 13.9 05/11/2022   PLT 275.0 05/11/2022   Last metabolic panel Lab Results  Component Value Date   GLUCOSE 82 05/11/2022   NA 141 05/11/2022   K 3.7 05/11/2022   CL 102 05/11/2022   CO2 29 05/11/2022   BUN 12 05/11/2022   CREATININE 0.74 05/11/2022   GFR 88.12 05/11/2022   CALCIUM 10.4 05/11/2022   PROT 8.0 05/11/2022   ALBUMIN 5.2 05/11/2022   BILITOT 0.9 05/11/2022   ALKPHOS 82 05/11/2022   AST 24 05/11/2022   ALT 24 05/11/2022   ANIONGAP 16 (H) 12/20/2013   Last lipids Lab Results  Component Value Date   CHOL 241 (H) 05/11/2022   HDL 81.80 05/11/2022   LDLCALC 131 (H) 05/11/2022   TRIG 139.0 05/11/2022   CHOLHDL 3 05/11/2022   Last thyroid functions Lab Results  Component Value Date   TSH 2.44 05/11/2022      The 10-year ASCVD risk  score (Arnett DK, et al., 2019) is: 6.6%    Assessment & Plan:   Problem List Items Addressed This Visit   None Visit Diagnoses       Physical exam    -  Primary     She has hypertension history and blood pressure was up today which is atypical for her.  We have recommended close monitoring and she will get a home cuff and monitor regularly  next month and bring in her cuff for comparison with ours with some readings in 1 month.  We also gave her handout on DASH diet.  We strongly encouraged her to try to lose some weight.  Try to keep daily sodium intake less than 2000 mg.  Add additional medication if up at follow-up  -Discussed immunizations including influenza, pneumococcal, Shingrix and she declines -Her mammograms up-to-date and we are still waiting for records from mammogram that was done a week ago -She will return later for fasting labs. -She is doing Cologuard for colon cancer screening and will be due for repeat 2026  No follow-ups on file.    Evelena Peat, MD

## 2023-07-11 NOTE — Patient Instructions (Signed)
Monitor blood pressure and record readings  Set up one month follow up and bring in your cuff at that time for check with ours.

## 2023-07-12 ENCOUNTER — Encounter: Payer: Self-pay | Admitting: Family Medicine

## 2023-07-30 ENCOUNTER — Other Ambulatory Visit: Payer: Self-pay | Admitting: Family Medicine

## 2023-08-19 ENCOUNTER — Encounter: Payer: Self-pay | Admitting: Family Medicine

## 2023-08-22 ENCOUNTER — Ambulatory Visit (INDEPENDENT_AMBULATORY_CARE_PROVIDER_SITE_OTHER): Payer: PRIVATE HEALTH INSURANCE | Admitting: Family Medicine

## 2023-08-22 ENCOUNTER — Encounter: Payer: Self-pay | Admitting: Family Medicine

## 2023-08-22 VITALS — BP 138/82 | HR 57 | Temp 98.4°F | Wt 207.8 lb

## 2023-08-22 DIAGNOSIS — I10 Essential (primary) hypertension: Secondary | ICD-10-CM

## 2023-08-22 NOTE — Patient Instructions (Signed)
 Let me know if BP is consistently > 140 systolic or 90 diastolic.

## 2023-08-22 NOTE — Progress Notes (Signed)
   Established Patient Office Visit  Subjective   Patient ID: Jordan Gallegos, female    DOB: 1960/07/23  Age: 63 y.o. MRN: [id number]  Chief Complaint  Patient presents with   Annual Exam    HPI   Jordan Gallegos is here for follow-up regarding hypertension.  Refer to recent note from January.  She was here for physical and had elevated blood pressure reading.  She has been monitoring this regularly at home with Omron cuff and has had consistent readings 120s to 130s systolic and mostly 70s diastolic.  She sent in log of multiple readings and these were consistently well-controlled.  She has had rare readings over 140s systolic.  Generally feels well.  She has been walking some but hopes to walk more.  Tries to cook with less sodium.  No recent headaches or dizziness.  She does take lisinopril 10 mg daily and compliant with therapy.  Past Medical History:  Diagnosis Date   Allergy    Asthma    Chicken pox    Frequent headaches    Hypertension    Urinary tract infection    Past Surgical History:  Procedure Laterality Date   CHOLECYSTECTOMY      reports that she has never smoked. She has never used smokeless tobacco. She reports that she does not drink alcohol and does not use drugs. family history includes Arthritis in her mother; Hypertension in her father and mother; Parkinson's disease in her father. Allergies  Allergen Reactions   Tylenol With Codeine #3 [Acetaminophen-Codeine] Nausea And Vomiting   Codeine Other (See Comments)    Tylenol with Codeine (Vomiting)    Review of Systems  Constitutional:  Negative for malaise/fatigue.  Eyes:  Negative for blurred vision.  Respiratory:  Negative for shortness of breath.   Cardiovascular:  Negative for chest pain.  Neurological:  Negative for dizziness, weakness and headaches.      Objective:     BP 138/82 (BP Location: Left Arm, Cuff Size: Normal)   Pulse (!) 57   Temp 98.4 F (36.9 C) (Oral)   Wt 207 lb 12.8 oz (94.3 kg)    SpO2 96%   BMI 34.21 kg/m  BP Readings from Last 3 Encounters:  08/22/23 138/82  07/11/23 (!) 158/84  05/11/22 122/74   Wt Readings from Last 3 Encounters:  08/22/23 207 lb 12.8 oz (94.3 kg)  07/11/23 206 lb 14.4 oz (93.8 kg)  05/11/22 208 lb 1.6 oz (94.4 kg)      Physical Exam Vitals reviewed.  Cardiovascular:     Rate and Rhythm: Normal rate and regular rhythm.  Pulmonary:     Effort: Pulmonary effort is normal.     Breath sounds: Normal breath sounds.  Neurological:     Mental Status: She is alert.      No results found for any visits on 08/22/23.    The 10-year ASCVD risk score (Arnett DK, et al., 2019) is: 5.1%    Assessment & Plan:   Hypertension.  Possible element of whitecoat syndrome.  Blood pressures at home have been consistently well-controlled.  Blood pressure here today improved compared to last visit.  We recommend continued close monitoring.  Again discussed nonpharmacologic management with weight control, sodium reduction, regular aerobic exercise such as walking.  Be in touch if consistent systolic readings over 140 or diastolic over 90. Jordan Peat, MD

## 2023-10-27 ENCOUNTER — Other Ambulatory Visit: Payer: Self-pay | Admitting: Family Medicine

## 2024-01-22 ENCOUNTER — Other Ambulatory Visit: Payer: Self-pay | Admitting: Family Medicine

## 2024-02-21 ENCOUNTER — Telehealth: Payer: Self-pay | Admitting: Urology

## 2024-02-21 NOTE — Telephone Encounter (Signed)
 Pt daughter called to set up apt for Jordan Gallegos I called the back to speak to [name] to get her scheduled for recurrent urology issues.

## 2024-07-07 ENCOUNTER — Other Ambulatory Visit: Payer: Self-pay | Admitting: Family Medicine

## 2024-08-13 ENCOUNTER — Encounter: Payer: PRIVATE HEALTH INSURANCE | Admitting: Family Medicine
